# Patient Record
Sex: Female | Born: 1980 | Race: Black or African American | Hispanic: No | Marital: Single | Smoking: Heavy tobacco smoker
Health system: Southern US, Community
[De-identification: ages and names within clinical notes are randomized; demographics above are authoritative.]

## PROBLEM LIST (undated history)

## (undated) DIAGNOSIS — G894 Chronic pain syndrome: Secondary | ICD-10-CM

## (undated) DIAGNOSIS — F1110 Opioid abuse, uncomplicated: Secondary | ICD-10-CM

## (undated) DIAGNOSIS — N12 Tubulo-interstitial nephritis, not specified as acute or chronic: Secondary | ICD-10-CM

## (undated) DIAGNOSIS — A539 Syphilis, unspecified: Secondary | ICD-10-CM

## (undated) DIAGNOSIS — D573 Sickle-cell trait: Secondary | ICD-10-CM

## (undated) DIAGNOSIS — F319 Bipolar disorder, unspecified: Secondary | ICD-10-CM

## (undated) DIAGNOSIS — M069 Rheumatoid arthritis, unspecified: Secondary | ICD-10-CM

## (undated) DIAGNOSIS — N189 Chronic kidney disease, unspecified: Secondary | ICD-10-CM

## (undated) HISTORY — PX: TUBAL LIGATION: SHX77

---

## 2005-02-18 DIAGNOSIS — F319 Bipolar disorder, unspecified: Secondary | ICD-10-CM

## 2005-02-18 HISTORY — DX: Bipolar disorder, unspecified: F31.9

## 2012-02-19 DIAGNOSIS — F1110 Opioid abuse, uncomplicated: Secondary | ICD-10-CM

## 2012-02-19 HISTORY — DX: Opioid abuse, uncomplicated: F11.10

## 2013-04-09 DIAGNOSIS — IMO0002 Reserved for concepts with insufficient information to code with codable children: Secondary | ICD-10-CM | POA: Insufficient documentation

## 2013-04-09 DIAGNOSIS — F1111 Opioid abuse, in remission: Secondary | ICD-10-CM | POA: Insufficient documentation

## 2013-09-16 DIAGNOSIS — Z309 Encounter for contraceptive management, unspecified: Secondary | ICD-10-CM | POA: Insufficient documentation

## 2014-02-18 DIAGNOSIS — M069 Rheumatoid arthritis, unspecified: Secondary | ICD-10-CM

## 2014-02-18 HISTORY — DX: Rheumatoid arthritis, unspecified: M06.9

## 2014-09-08 DIAGNOSIS — N12 Tubulo-interstitial nephritis, not specified as acute or chronic: Secondary | ICD-10-CM

## 2014-09-08 HISTORY — DX: Tubulo-interstitial nephritis, not specified as acute or chronic: N12

## 2014-11-03 ENCOUNTER — Other Ambulatory Visit: Payer: Self-pay | Admitting: Advanced Practice Midwife

## 2014-11-03 DIAGNOSIS — M069 Rheumatoid arthritis, unspecified: Secondary | ICD-10-CM

## 2014-11-03 DIAGNOSIS — F209 Schizophrenia, unspecified: Secondary | ICD-10-CM

## 2014-11-29 DIAGNOSIS — E6609 Other obesity due to excess calories: Secondary | ICD-10-CM | POA: Insufficient documentation

## 2014-12-08 ENCOUNTER — Ambulatory Visit
Admission: RE | Admit: 2014-12-08 | Discharge: 2014-12-08 | Disposition: A | Discharge status: Home / Self Care | Payer: Medicaid Other | Source: Ambulatory Visit | Attending: Maternal & Fetal Medicine | Admitting: Maternal & Fetal Medicine

## 2014-12-08 ENCOUNTER — Ambulatory Visit (HOSPITAL_BASED_OUTPATIENT_CLINIC_OR_DEPARTMENT_OTHER)
Admission: RE | Admit: 2014-12-08 | Discharge: 2014-12-08 | Disposition: A | Discharge status: Home / Self Care | Payer: Medicaid Other | Source: Ambulatory Visit | Attending: Maternal & Fetal Medicine | Admitting: Maternal & Fetal Medicine

## 2014-12-08 DIAGNOSIS — D573 Sickle-cell trait: Secondary | ICD-10-CM | POA: Diagnosis not present

## 2014-12-08 DIAGNOSIS — O99342 Other mental disorders complicating pregnancy, second trimester: Secondary | ICD-10-CM | POA: Diagnosis not present

## 2014-12-08 DIAGNOSIS — M069 Rheumatoid arthritis, unspecified: Secondary | ICD-10-CM | POA: Insufficient documentation

## 2014-12-08 DIAGNOSIS — F209 Schizophrenia, unspecified: Secondary | ICD-10-CM | POA: Diagnosis not present

## 2014-12-08 DIAGNOSIS — Z3A18 18 weeks gestation of pregnancy: Secondary | ICD-10-CM | POA: Insufficient documentation

## 2014-12-08 DIAGNOSIS — Z8279 Family history of other congenital malformations, deformations and chromosomal abnormalities: Secondary | ICD-10-CM | POA: Diagnosis not present

## 2014-12-08 HISTORY — DX: Rheumatoid arthritis, unspecified: M06.9

## 2014-12-08 HISTORY — DX: Chronic pain syndrome: G89.4

## 2014-12-08 HISTORY — DX: Opioid abuse, uncomplicated: F11.10

## 2014-12-08 HISTORY — DX: Tubulo-interstitial nephritis, not specified as acute or chronic: N12

## 2014-12-08 HISTORY — DX: Bipolar disorder, unspecified: F31.9

## 2014-12-08 HISTORY — DX: Sickle-cell trait: D57.3

## 2014-12-08 NOTE — Progress Notes (Addendum)
Referring physician:  [facility] Department Length of Consultation: 50 minutes  Stacey Howe  was referred for genetic counseling to discuss her family history and prenatal testing options.  This note is a summary of our discussion.  We first obtained a detailed family and pregnancy history.  The patient reported that her first son was born with two holes in his heart and an abnormal valve which required surgery at 32 years old.  He has since been in good health, with no developmental delays or health concerns.  She reported that his father and a paternal half sister both have heart defects as well.  Her other children are all healthy (they have different fathers), as are the two daughters of the father of the baby.    Without more information about the cause for her condition, an accurate risk assessment for other family members is difficult.  The family history is otherwise reported to be unremarkable for birth defects, mental retardation, recurrent pregnancy loss or known chromosome abnormalities.  In reviewing her medical history, it was also noted that Stacey Howe has sickle cell trait.  She is aware of this history and states that her children all have the trait as well.  She is not aware of any testing for Stacey Howe.    Stacey Howe denied any pregnancy complications thus far in this pregnancy.  She was taking several medications when she conceived which were prescribed for rheumatoid arthritis.  She stated those medications were prednisone, methotrexate and percoset.  She stopped the medications as soon as she missed her period and found out she was pregnant, prior to [redacted] weeks gestation.  We reviewed that methotrexate in particular is a medication that is known to increase the risk for birth defects, however the critical period for malformations appears to be 6-8 weeks after conception, so we would not expect this fetus to be at high risk based upon how early she stopped the medications.  [name] also has a diagnosis of bipolar.  She has not taken medications for this in several years.  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  The patient denied recreational drug and alcoholuse in pregnancy.  She has cut down to 2 cigarettes per day.  As we discussed, smoking during pregnancy has been associated with low birth weight, premature delivery and pregnancy loss.  For this reason, we suggest that she cut back or avoid smoking for the remainder of the pregnancy.  Mental health conditions are known to have strong inherited factors in some families.  Though we do not fully understand what the genetic factors are, we would expect that this child may be at increased risk for some mental health conditions.  We discussed that congenital heart defects (CHDs) occur in approximately 1 in 200 births.  Congenital heart defects are often thought to be multifactorial, meaning due to complex interactions between genetic and environmental factors, although some specific genetic changes and syndromes are associated with congenital heart defects.  In the absence of an underlying genetic condition, the chance of congenital heart defects in siblings of individuals with heart defects is thought to be 3-5%.  However, given the fact that in this family, the father of her son was also affected, and this pregnancy is not related to him, the risks are likely lower.  Though the chance for a  similar condition in this pregnancy is low, we did offer the option of a fetal echocardiogram at 22 to 24 weeks in this and subsequent pregnancies for this pregnancy.    We also discussed that 1 in 10 individuals of African American ancestry have sickle cell trait.  We reviewed some general information about sickle cell disease and trait.  Sickle cell anemia is caused by a change in the  hemoglobin.  Hemoglobin is the substance in red blood cells that carries oxygen.  When there is a change in the structure of the hemoglobin, there are problems in the way the blood carries oxygen.  One specific change causes the cells to take on a sickle, or half moon, shape instead of the usual round shape.  This is called sickle cell anemia.  People with sickle cell disease are at an increased risk for infections, stroke, damage to certain organs, painful crises and other medical complications.  Sickle cell disease is an autosomal recessive condition, meaning that both parents must be carriers in order to have a child with the condition. Because Stacey Howe is known to be a carrier, we offered to carrier testing to her partner, Stacey Howe. She planned to speak with him and have him tested at [facility] if desired.  We would recommend a CBC and hemoglobin fractionation.  We are happy to review those records if he chooses testing. If he is found to be a carrier, then testing on the pregnancy could be considered.  All babies in [place] are testing through newborn screening for sickle cell disease.  A detailed anatomy ultrasound was performed today.  See that report for full details. Records indicate the patient had maternal serum screening at [facility].  We are happy to review those records and discuss any concerns with the patient.  A fetal echocardiogram was also scheduled for after [redacted] weeks gestation.  We appreciate very much the opportunity to be involved with the care of your patient.  If the patient has further questions or concerns, please do not hesitate to call us at [phone].  Stacey Anderson, MS, CGC

## 2014-12-08 NOTE — Progress Notes (Signed)
Genetic counseling recommendations and assessment reviewed with [facility] Wells, I agree with her consultation as outlined.  

## 2014-12-25 DIAGNOSIS — Z5900 Homelessness unspecified: Secondary | ICD-10-CM | POA: Insufficient documentation

## 2014-12-25 DIAGNOSIS — F319 Bipolar disorder, unspecified: Secondary | ICD-10-CM | POA: Insufficient documentation

## 2014-12-25 DIAGNOSIS — O99332 Smoking (tobacco) complicating pregnancy, second trimester: Secondary | ICD-10-CM | POA: Insufficient documentation

## 2014-12-25 DIAGNOSIS — O0992 Supervision of high risk pregnancy, unspecified, second trimester: Secondary | ICD-10-CM | POA: Insufficient documentation

## 2014-12-25 DIAGNOSIS — O34219 Maternal care for unspecified type scar from previous cesarean delivery: Secondary | ICD-10-CM | POA: Insufficient documentation

## 2014-12-25 DIAGNOSIS — M069 Rheumatoid arthritis, unspecified: Secondary | ICD-10-CM | POA: Insufficient documentation

## 2014-12-25 DIAGNOSIS — O09292 Supervision of pregnancy with other poor reproductive or obstetric history, second trimester: Secondary | ICD-10-CM | POA: Insufficient documentation

## 2014-12-25 DIAGNOSIS — D573 Sickle-cell trait: Secondary | ICD-10-CM | POA: Insufficient documentation

## 2014-12-26 DIAGNOSIS — Z302 Encounter for sterilization: Secondary | ICD-10-CM | POA: Insufficient documentation

## 2015-03-01 DIAGNOSIS — N760 Acute vaginitis: Secondary | ICD-10-CM | POA: Insufficient documentation

## 2015-04-03 ENCOUNTER — Encounter: Payer: Self-pay | Admitting: Urgent Care

## 2015-04-03 DIAGNOSIS — O99513 Diseases of the respiratory system complicating pregnancy, third trimester: Secondary | ICD-10-CM | POA: Diagnosis not present

## 2015-04-03 DIAGNOSIS — J111 Influenza due to unidentified influenza virus with other respiratory manifestations: Secondary | ICD-10-CM | POA: Insufficient documentation

## 2015-04-03 DIAGNOSIS — O211 Hyperemesis gravidarum with metabolic disturbance: Secondary | ICD-10-CM | POA: Diagnosis not present

## 2015-04-03 DIAGNOSIS — Z3A36 36 weeks gestation of pregnancy: Secondary | ICD-10-CM | POA: Insufficient documentation

## 2015-04-03 DIAGNOSIS — F17210 Nicotine dependence, cigarettes, uncomplicated: Secondary | ICD-10-CM | POA: Insufficient documentation

## 2015-04-03 DIAGNOSIS — O99333 Smoking (tobacco) complicating pregnancy, third trimester: Secondary | ICD-10-CM | POA: Insufficient documentation

## 2015-04-03 DIAGNOSIS — Z79899 Other long term (current) drug therapy: Secondary | ICD-10-CM | POA: Diagnosis not present

## 2015-04-03 LAB — URINALYSIS COMPLETE WITH MICROSCOPIC (ARMC ONLY)
BACTERIA UA: NONE SEEN
Bilirubin Urine: NEGATIVE
GLUCOSE, UA: NEGATIVE mg/dL
HGB URINE DIPSTICK: NEGATIVE
Nitrite: NEGATIVE
PROTEIN: NEGATIVE mg/dL
Specific Gravity, Urine: 1.012 (ref 1.005–1.030)
pH: 6 (ref 5.0–8.0)

## 2015-04-03 LAB — BASIC METABOLIC PANEL
Anion gap: 8 (ref 5–15)
BUN: <5 mg/dL — ABNORMAL LOW (ref 6–20)
CALCIUM: 8.3 mg/dL — AB (ref 8.9–10.3)
CO2: 21 mmol/L — AB (ref 22–32)
CREATININE: 0.66 mg/dL (ref 0.44–1.00)
Chloride: 104 mmol/L (ref 101–111)
GFR calc Af Amer: >60 mL/min (ref >60)
GFR calc non Af Amer: >60 mL/min (ref >60)
GLUCOSE: 83 mg/dL (ref 65–99)
Potassium: 3.3 mmol/L — ABNORMAL LOW (ref 3.5–5.1)
Sodium: 133 mmol/L — ABNORMAL LOW (ref 135–145)

## 2015-04-03 LAB — CBC
HEMATOCRIT: 30 % — AB (ref 35.0–47.0)
Hemoglobin: 10 g/dL — ABNORMAL LOW (ref 12.0–16.0)
MCH: 26.1 pg (ref 26.0–34.0)
MCHC: 33.3 g/dL (ref 32.0–36.0)
MCV: 78.4 fL — AB (ref 80.0–100.0)
Platelets: 147 10*3/uL — ABNORMAL LOW (ref 150–440)
RBC: 3.83 MIL/uL (ref 3.80–5.20)
RDW: 14.1 % (ref 11.5–14.5)
WBC: 9.9 10*3/uL (ref 3.6–11.0)

## 2015-04-03 MED ORDER — SODIUM CHLORIDE 0.9 % IV BOLUS (SEPSIS)
1000.0000 mL | Freq: Once | INTRAVENOUS | Status: AC
  Administered 2015-04-03: 1000 mL via INTRAVENOUS

## 2015-04-03 NOTE — ED Notes (Signed)
Patient presents with c/o flu-like symptoms x 2 days. Patient went to [facility] today and was to be admitted, however patient refused because of transportation issues. Patient with (+) N/V - Dx'd with flu. Of note, patient is a 36 week [id number]. Denies abd pain.

## 2015-04-04 ENCOUNTER — Emergency Department
Admission: EM | Admit: 2015-04-04 | Discharge: 2015-04-04 | Disposition: A | Discharge status: Home / Self Care | Payer: Medicaid Other | Attending: Emergency Medicine | Admitting: Emergency Medicine

## 2015-04-04 DIAGNOSIS — E860 Dehydration: Secondary | ICD-10-CM

## 2015-04-04 DIAGNOSIS — J111 Influenza due to unidentified influenza virus with other respiratory manifestations: Secondary | ICD-10-CM

## 2015-04-04 LAB — URINALYSIS COMPLETE WITH MICROSCOPIC (ARMC ONLY)
BILIRUBIN URINE: NEGATIVE
GLUCOSE, UA: NEGATIVE mg/dL
Hgb urine dipstick: NEGATIVE
Nitrite: NEGATIVE
PH: 6 (ref 5.0–8.0)
Protein, ur: NEGATIVE mg/dL
Specific Gravity, Urine: 1.005 (ref 1.005–1.030)

## 2015-04-04 MED ORDER — SODIUM CHLORIDE 0.9 % IV BOLUS (SEPSIS)
1000.0000 mL | Freq: Once | INTRAVENOUS | Status: AC
  Administered 2015-04-04: 1000 mL via INTRAVENOUS

## 2015-04-04 MED ORDER — OSELTAMIVIR PHOSPHATE 75 MG PO CAPS
75.0000 mg | ORAL_CAPSULE | Freq: Once | ORAL | Status: DC
  Filled 2015-04-04: qty 1

## 2015-04-04 NOTE — Discharge Instructions (Signed)
Dehydration, Adult Dehydration is a condition in which you do not have enough fluid or water in your body. It happens when you take in less fluid than you lose. Vital organs such as the kidneys, brain, and heart cannot function without a proper amount of fluids. Any loss of fluids from the body can cause dehydration.  Dehydration can range from mild to severe. This condition should be treated right away to help prevent it from becoming severe. CAUSES  This condition may be caused by:  Vomiting.  Diarrhea.  Excessive sweating, such as when exercising in hot or humid weather.  Not drinking enough fluid during strenuous exercise or during an illness.  Excessive urine output.  Fever.  Certain medicines. RISK FACTORS This condition is more likely to develop in:  People who are taking certain medicines that cause the body to lose excess fluid (diuretics).   People who have a chronic illness, such as diabetes, that may increase urination.  Older adults.   People who live at high altitudes.   People who participate in endurance sports.  SYMPTOMS  Mild Dehydration  Thirst.  Dry lips.  Slightly dry mouth.  Dry, warm skin. Moderate Dehydration  Very dry mouth.   Muscle cramps.   Dark urine and decreased urine production.   Decreased tear production.   Headache.   Light-headedness, especially when you stand up from a sitting position.  Severe Dehydration  Changes in skin.   Cold and clammy skin.   Skin does not spring back quickly when lightly pinched and released.   Changes in body fluids.   Extreme thirst.   No tears.   Not able to sweat when body temperature is high, such as in hot weather.   Minimal urine production.   Changes in vital signs.   Rapid, weak pulse (more than 100 beats per minute when you are sitting still).   Rapid breathing.   Low blood pressure.   Other changes.   Sunken eyes.   Cold hands and feet.    Confusion.  Lethargy and difficulty being awakened.  Fainting (syncope).   Short-term weight loss.   Unconsciousness. DIAGNOSIS  This condition may be diagnosed based on your symptoms. You may also have tests to determine how severe your dehydration is. These tests may include:   Urine tests.   Blood tests.  TREATMENT  Treatment for this condition depends on the severity. Mild or moderate dehydration can often be treated at home. Treatment should be started right away. Do not wait until dehydration becomes severe. Severe dehydration needs to be treated at the hospital. Treatment for Mild Dehydration  Drinking plenty of water to replace the fluid you have lost.   Replacing minerals in your blood (electrolytes) that you may have lost.  Treatment for Moderate Dehydration  Consuming oral rehydration solution (ORS). Treatment for Severe Dehydration  Receiving fluid through an IV tube.   Receiving electrolyte solution through a feeding tube that is passed through your nose and into your stomach (nasogastric tube or NG tube).  Correcting any abnormalities in electrolytes. HOME CARE INSTRUCTIONS   Drink enough fluid to keep your urine clear or pale yellow.   Drink water or fluid slowly by taking small sips. You can also try sucking on ice cubes.  Have food or beverages that contain electrolytes. Examples include bananas and sports drinks.  Take over-the-counter and prescription medicines only as told by your health care provider.   Prepare ORS according to the manufacturer's instructions. Take sips   of ORS every 5 minutes until your urine returns to normal.  If you have vomiting or diarrhea, continue to try to drink water, ORS, or both.   If you have diarrhea, avoid:   Beverages that contain caffeine.   Fruit juice.   Milk.   Carbonated soft drinks.  Do not take salt tablets. This can lead to the condition of having too much sodium in your body  (hypernatremia).  SEEK MEDICAL CARE IF:  You cannot eat or drink without vomiting.  You have had moderate diarrhea during a period of more than 24 hours.  You have a fever. SEEK IMMEDIATE MEDICAL CARE IF:   You have extreme thirst.  You have severe diarrhea.  You have not urinated in 6-8 hours, or you have urinated only a small amount of very dark urine.  You have shriveled skin.  You are dizzy, confused, or both.   This information is not intended to replace advice given to you by your health care provider. Make sure you discuss any questions you have with your health care provider.   Document Released: 02/04/2005 Document Revised: 10/26/2014 Document Reviewed: 06/22/2014 Elsevier Interactive Patient Education 2016 [employer] Inc[employer]  Influenza, Adult Influenza ("the flu") is a viral infection of the respiratory tract. It occurs more often in winter months because people spend more time in close contact with one another. Influenza can make you feel very sick. Influenza easily spreads from person to person (contagious). CAUSES  Influenza is caused by a virus that infects the respiratory tract. You can catch the virus by breathing in droplets from an infected person's cough or sneeze. You can also catch the virus by touching something that was recently contaminated with the virus and then touching your mouth, nose, or eyes. RISKS AND COMPLICATIONS You may be at risk for a more severe case of influenza if you smoke cigarettes, have diabetes, have chronic heart disease (such as heart failure) or lung disease (such as asthma), or if you have a weakened immune system. Elderly people and pregnant women are also at risk for more serious infections. The most common problem of influenza is a lung infection (pneumonia). Sometimes, this problem can require emergency medical care and may be life threatening. SIGNS AND SYMPTOMS  Symptoms typically last 4 to 10 days and may  include:  Fever.  Chills.  Headache, body aches, and muscle aches.  Sore throat.  Chest discomfort and cough.  Poor appetite.  Weakness or feeling tired.  Dizziness.  Nausea or vomiting. DIAGNOSIS  Diagnosis of influenza is often made based on your history and a physical exam. A nose or throat swab test can be done to confirm the diagnosis. TREATMENT  In mild cases, influenza goes away on its own. Treatment is directed at relieving symptoms. For more severe cases, your health care provider may prescribe antiviral medicines to shorten the sickness. Antibiotic medicines are not effective because the infection is caused by a virus, not by bacteria. HOME CARE INSTRUCTIONS  Take medicines only as directed by your health care provider.  Use a cool mist humidifier to make breathing easier.  Get plenty of rest until your temperature returns to normal. This usually takes 3 to 4 days.  Drink enough fluid to keep your urine clear or pale yellow.  Cover yourmouth and nosewhen coughing or sneezing,and wash your handswellto prevent thevirusfrom spreading.  Stay homefromwork orschool untilthe fever is gonefor at least 1full day. PREVENTION  An annual influenza vaccination (flu shot) is the best   way to avoid getting influenza. An annual flu shot is now routinely recommended for all adults in the [place] SEEK MEDICAL CARE IF:  You experiencechest pain, yourcough worsens,or you producemore mucus.  Youhave nausea,vomiting, ordiarrhea.  Your fever returns or gets worse. SEEK IMMEDIATE MEDICAL CARE IF:  You havetrouble breathing, you become short of breath,or your skin ornails becomebluish.  You have severe painor stiffnessin the neck.  You develop a sudden headache, or pain in the face or ear.  You have nausea or vomiting that you cannot control. MAKE SURE YOU:   Understand these instructions.  Will watch your condition.  Will get help right away if you  are not doing well or get worse.   This information is not intended to replace advice given to you by your health care provider. Make sure you discuss any questions you have with your health care provider.   Document Released: 02/02/2000 Document Revised: 02/25/2014 Document Reviewed: 05/06/2011 Elsevier Interactive Patient Education 2016 Elsevier Inc.  

## 2015-04-04 NOTE — ED Notes (Signed)
Patient with no complaints at this time. Respirations even and unlabored. Skin warm/dry. Discharge instructions reviewed with patient at this time. Patient given opportunity to voice concerns/ask questions. IV removed per policy and band-aid applied to site. Patient discharged at this time and left Emergency Department, via wheelchair.   

## 2015-04-04 NOTE — ED Provider Notes (Signed)
[facility] Emergency Department Provider Note  ____________________________________________  Time seen: 1:30 AM  I have reviewed the triage vital signs and the nursing notes.   HISTORY  Chief Complaint Influenza      HPI Stacey Howe is a 35 y.o. female G9 P4 abortions 336 weeks pregnant presents with generalized body aches and subjective fevers cough 3 days. Patient states that she was diagnosed with a fluid [facility] yesterday" (they wanted to keep me to give me fluids". However the patient states that she was unable to stay at that time due to childcare issues.     Past Medical History  Diagnosis Date  . Pyelonephritis 09/08/14  . 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 Chronic pain syndrome   . Rheumatoid arthritis (HCC) 2016  . Sickle cell trait (HCC)   . Bipolar affective disorder [facility]) 2007    Patient Active Problem List   Diagnosis Date Noted  . Family history of congenital heart defect 12/08/2014    Past Surgical History  Procedure Laterality Date  . Cesarean section  2000  . Cesarean section  2015    Current Outpatient Rx  Name  Route  Sig  Dispense  Refill  . acetaminophen (TYLENOL) 500 MG tablet   Oral   Take 500 mg by mouth every 6 (six) hours as needed for mild pain or headache.         . calcium carbonate (TUMS EX) 750 MG chewable tablet   Oral   Chew 1 tablet by mouth daily.         . Prenatal Vit-Fe Fumarate-FA (PRENATAL MULTIVITAMIN) TABS tablet   Oral   Take 1 tablet by mouth daily at 12 noon.           Allergies Vicodin  No family history on file.  Social History Social History  Substance Use Topics  . Smoking status: Light Tobacco Smoker -- 0.25 packs/day for 15 years    Types: Cigarettes  . Smokeless tobacco: Never Used  . Alcohol Use: No    Review of Systems  Constitutional: Positive for fever. Eyes: Negative for visual changes. ENT: Negative for sore  throat. Cardiovascular: Negative for chest pain. Respiratory: Negative for shortness of breath. Gastrointestinal: Negative for abdominal pain, vomiting and diarrhea. Genitourinary: Negative for dysuria. Musculoskeletal: Negative for back pain. Positive for generalized myalgia Skin: Negative for rash. Neurological: Negative for headaches, focal weakness or numbness.   10-point ROS otherwise negative.  ____________________________________________   PHYSICAL EXAM:  VITAL SIGNS: ED Triage Vitals  Enc Vitals Group     BP 04/03/15 2134 110/64 mmHg     Pulse Rate 04/03/15 2134 122     Resp 04/03/15 2134 20     Temp 04/03/15 2134 98.2 F (36.8 C)     Temp Source 04/03/15 2134 Oral     SpO2 04/03/15 2134 100 %     Weight 04/03/15 2134 216 lb (97.977 kg)     Height 04/03/15 2134 5\' 6"  (1.676 m)     Head Cir --      Peak Flow --      Pain Score 04/03/15 2134 10     Pain Loc --      Pain Edu? --      Excl. in GC? --      Constitutional: Alert and oriented. Well appearing and in no distress. Eyes: Conjunctivae are normal. PERRL. Normal extraocular movements. ENT   Head: Normocephalic and atraumatic.  Nose: No congestion/rhinnorhea.   Mouth/Throat: Dry oral and nasal mucosa   Neck: No stridor. Hematological/Lymphatic/Immunilogical: No cervical lymphadenopathy. Cardiovascular: Normal rate, regular rhythm. Normal and symmetric distal pulses are present in all extremities. No murmurs, rubs, or gallops. Respiratory: Normal respiratory effort without tachypnea nor retractions. Breath sounds are clear and equal bilaterally. No wheezes/rales/rhonchi. Gastrointestinal: Soft and nontender. No distention. There is no CVA tenderness. Genitourinary: deferred Musculoskeletal: Nontender with normal range of motion in all extremities. No joint effusions.  No lower extremity tenderness nor edema. Neurologic:  Normal speech and language. No gross focal neurologic deficits are  appreciated. Speech is normal.  Skin:  Skin is warm, dry and intact. No rash noted. Psychiatric: Mood and affect are normal. Speech and behavior are normal. Patient exhibits appropriate insight and judgment.  ____________________________________________    LABS (pertinent positives/negatives)  Labs Reviewed  CBC - Abnormal; Notable for the following:    Hemoglobin 10.0 (*)    HCT 30.0 (*)    MCV 78.4 (*)    Platelets 147 (*)    All other components within normal limits  BASIC METABOLIC PANEL - Abnormal; Notable for the following:    Sodium 133 (*)    Potassium 3.3 (*)    CO2 21 (*)    BUN <5 (*)    Calcium 8.3 (*)    All other components within normal limits  URINALYSIS COMPLETEWITH MICROSCOPIC ([facility] ONLY) - Abnormal; Notable for the following:    Color, Urine YELLOW (*)    APPearance HAZY (*)    Ketones, ur 2+ (*)    Leukocytes, UA 3+ (*)    Squamous Epithelial / LPF 6-30 (*)    All other components within normal limits        INITIAL IMPRESSION / ASSESSMENT AND PLAN / ED COURSE  Pertinent labs & imaging results that were available during my care of the patient were reviewed by me and considered in my medical decision making (see chart for details).  2 liters IV NS given which symptomatically improvement. Patient advised to continue oral hydration at home. In addition patient advised to follow up with OB/GYN at [facility] today  ____________________________________________   FINAL CLINICAL IMPRESSION(S) / ED DIAGNOSES  Final diagnoses:  Influenza  Dehydration      Darci Current, MD 04/05/15 2250

## 2015-04-09 DIAGNOSIS — R899 Unspecified abnormal finding in specimens from other organs, systems and tissues: Secondary | ICD-10-CM | POA: Insufficient documentation

## 2015-05-31 DIAGNOSIS — F418 Other specified anxiety disorders: Secondary | ICD-10-CM

## 2015-05-31 DIAGNOSIS — O99345 Other mental disorders complicating the puerperium: Secondary | ICD-10-CM | POA: Insufficient documentation

## 2015-09-27 ENCOUNTER — Emergency Department
Admission: EM | Admit: 2015-09-27 | Discharge: 2015-09-27 | Disposition: A | Discharge status: Home / Self Care | Payer: Medicaid Other | Attending: Emergency Medicine | Admitting: Emergency Medicine

## 2015-09-27 ENCOUNTER — Emergency Department: Payer: Medicaid Other

## 2015-09-27 DIAGNOSIS — N12 Tubulo-interstitial nephritis, not specified as acute or chronic: Secondary | ICD-10-CM | POA: Insufficient documentation

## 2015-09-27 DIAGNOSIS — Z79899 Other long term (current) drug therapy: Secondary | ICD-10-CM | POA: Diagnosis not present

## 2015-09-27 DIAGNOSIS — F17210 Nicotine dependence, cigarettes, uncomplicated: Secondary | ICD-10-CM | POA: Diagnosis not present

## 2015-09-27 DIAGNOSIS — R1032 Left lower quadrant pain: Secondary | ICD-10-CM | POA: Diagnosis present

## 2015-09-27 LAB — URINALYSIS COMPLETE WITH MICROSCOPIC (ARMC ONLY)
BACTERIA UA: NONE SEEN
Bilirubin Urine: NEGATIVE
Glucose, UA: NEGATIVE mg/dL
Ketones, ur: NEGATIVE mg/dL
Nitrite: NEGATIVE
PH: 7 (ref 5.0–8.0)
PROTEIN: 100 mg/dL — AB
SPECIFIC GRAVITY, URINE: 1.006 (ref 1.005–1.030)

## 2015-09-27 LAB — COMPREHENSIVE METABOLIC PANEL
ALBUMIN: 4 g/dL (ref 3.5–5.0)
ALT: 19 U/L (ref 14–54)
AST: 18 U/L (ref 15–41)
Alkaline Phosphatase: 63 U/L (ref 38–126)
Anion gap: 4 — ABNORMAL LOW (ref 5–15)
BUN: 9 mg/dL (ref 6–20)
CHLORIDE: 103 mmol/L (ref 101–111)
CO2: 31 mmol/L (ref 22–32)
Calcium: 8.8 mg/dL — ABNORMAL LOW (ref 8.9–10.3)
Creatinine, Ser: 1.03 mg/dL — ABNORMAL HIGH (ref 0.44–1.00)
GFR calc Af Amer: >60 mL/min (ref >60)
GLUCOSE: 92 mg/dL (ref 65–99)
POTASSIUM: 3.2 mmol/L — AB (ref 3.5–5.1)
Sodium: 138 mmol/L (ref 135–145)
Total Bilirubin: 0.9 mg/dL (ref 0.3–1.2)
Total Protein: 8.1 g/dL (ref 6.5–8.1)

## 2015-09-27 LAB — CBC
HEMATOCRIT: 41.3 % (ref 35.0–47.0)
Hemoglobin: 14.2 g/dL (ref 12.0–16.0)
MCH: 27.2 pg (ref 26.0–34.0)
MCHC: 34.3 g/dL (ref 32.0–36.0)
MCV: 79.2 fL — AB (ref 80.0–100.0)
Platelets: 183 10*3/uL (ref 150–440)
RBC: 5.21 MIL/uL — ABNORMAL HIGH (ref 3.80–5.20)
RDW: 14.8 % — AB (ref 11.5–14.5)
WBC: 10.9 10*3/uL (ref 3.6–11.0)

## 2015-09-27 LAB — POCT PREGNANCY, URINE: PREG TEST UR: NEGATIVE

## 2015-09-27 LAB — LIPASE, BLOOD: LIPASE: 37 U/L (ref 11–51)

## 2015-09-27 MED ORDER — KETOROLAC TROMETHAMINE 30 MG/ML IJ SOLN
15.0000 mg | Freq: Once | INTRAMUSCULAR | Status: AC
  Administered 2015-09-27: 15 mg via INTRAVENOUS
  Filled 2015-09-27: qty 1

## 2015-09-27 MED ORDER — DEXTROSE 5 % IV SOLN
1.0000 g | INTRAVENOUS | Status: DC
  Administered 2015-09-27: 1 g via INTRAVENOUS
  Filled 2015-09-27: qty 10

## 2015-09-27 MED ORDER — CEPHALEXIN 500 MG PO CAPS: 500.0000 mg | ORAL_CAPSULE | Freq: Three times a day (TID) | ORAL | 0 refills | Status: DC

## 2015-09-27 MED ORDER — ONDANSETRON 4 MG PO TBDP: ORAL_TABLET | ORAL | 0 refills | Status: DC

## 2015-09-27 MED ORDER — ONDANSETRON HCL 4 MG/2ML IJ SOLN
4.0000 mg | INTRAMUSCULAR | Status: AC
  Administered 2015-09-27: 4 mg via INTRAVENOUS
  Filled 2015-09-27: qty 2

## 2015-09-27 NOTE — Discharge Instructions (Signed)
You have been seen in the Emergency Department (ED) today for pain that we believe is due to a urinary tract infection (UTI) which has affected your kidney as well.  Please take THE FULL COURSE of antibiotics as prescribed and over-the-counter pain medication (Tylenol or Motrin) as needed, but no more than recommended on the label instructions.  Drink PLENTY of fluids.  Call your regular doctor to schedule the next available appointment to follow up on today?s ED visit, or return immediately to the ED if your pain worsens, you have decreased urine production, develop fever, persistent vomiting, or other symptoms that concern you.

## 2015-09-27 NOTE — ED Triage Notes (Signed)
Pt arrives to ED from home via [facility] with c/o LEFT flank pain with radiation into LEFT upper and lower abdomen. Pt reports s/x's began yesterday morning and have been constant. Pt also w/o c/o urinary frequency, pain, burning, and malodor. Pt reports taking (10) 500mg  Tylenol tablets without relief. Pt also reports taking (2) Tylenol PM about 4 hrs ago. Pt reports h/x of arthritis and kidney infections and UTIs, but denies any previous GI history.

## 2015-09-27 NOTE — ED Notes (Signed)
Patient transported to CT 

## 2015-09-27 NOTE — ED Provider Notes (Signed)
[facility] Emergency Department Provider Note  ____________________________________________   First MD Initiated Contact with Patient 09/27/15 0425     (approximate)  I have reviewed the triage vital signs and the nursing notes.   HISTORY  Chief Complaint Abdominal Pain and Urinary Tract Infection    HPI Stacey Howe is a 35 y.o. female with a medical history as listed below who presents for acute onset severe left flank pain that occurred about 24 hours ago.  She states that it started all of a sudden felt like someone stabbing her in the left flank.  It was moderate in intensity initially but it is worsened over time and is now severe.  She is also having some burning dysuria.  She states that movement makes the pain worse and she cannot find a comfortable position.  She has also had 3 episodes of vomiting today and has been feeling chills and has no recorded fever.  She denies chest pain, shortness of breath, vaginal bleeding, vaginal discharge.  She has no lower abdominal pain, seems to all be in her flank.  She denies a history of kidney stones but she has had urinary tract infections in the past.   Past Medical History:  Diagnosis Date  . Bipolar affective disorder (HCC) 2007  . Chronic pain syndrome   . 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 Pyelonephritis 09/08/14  . Rheumatoid arthritis (HCC) 2016  . Sickle cell trait [facility])     Patient Active Problem List   Diagnosis Date Noted  . Family history of congenital heart defect 12/08/2014    Past Surgical History:  Procedure Laterality Date  . CESAREAN SECTION  2000  . CESAREAN SECTION  2015  . TUBAL LIGATION      Prior to Admission medications   Medication Sig Start Date End Date Taking? Authorizing Provider  clonazePAM (KLONOPIN) 1 MG tablet Take 1 mg by mouth 3 (three) times daily as needed for anxiety.   Yes Historical Provider, MD  methotrexate (RHEUMATREX) 2.5 MG  tablet Take 10 mg by mouth once a week. Caution:Chemotherapy. Protect from light.   Yes Historical Provider, MD  sertraline (ZOLOFT) 100 MG tablet Take 100 mg by mouth daily.   Yes Historical Provider, MD  acetaminophen (TYLENOL) 500 MG tablet Take 500 mg by mouth every 6 (six) hours as needed for mild pain or headache.    Historical Provider, MD  calcium carbonate (TUMS EX) 750 MG chewable tablet Chew 1 tablet by mouth daily.    Historical Provider, MD  cephALEXin (KEFLEX) 500 MG capsule Take 1 capsule (500 mg total) by mouth 3 (three) times daily. 09/27/15   Loleta Rose, MD  ondansetron (ZOFRAN ODT) 4 MG disintegrating tablet Allow 1-2 tablets to dissolve in your mouth every 8 hours as needed for nausea/vomiting 09/27/15   Loleta Rose, MD  Prenatal Vit-Fe Fumarate-FA (PRENATAL MULTIVITAMIN) TABS tablet Take 1 tablet by mouth daily at 12 noon.    Historical Provider, MD    Allergies Vicodin [hydrocodone-acetaminophen]  History reviewed. No pertinent family history.  Social History Social History  Substance Use Topics  . Smoking status: Light Tobacco Smoker    Packs/day: 0.25    Years: 15.00    Types: Cigarettes  . Smokeless tobacco: Never Used  . Alcohol use No    Review of Systems Constitutional: +chills Eyes: No visual changes. ENT: No sore throat. Cardiovascular: Denies chest pain. Respiratory: Denies shortness of breath. Gastrointestinal: No abdominal  pain.  Severe left flank pain.  Nausea and emesis x 3.  No diarrhea.  No constipation. Genitourinary: +dysuria. Musculoskeletal: Left flank pain. Skin: Negative for rash. Neurological: Negative for headaches, focal weakness or numbness.  10-point ROS otherwise negative.  ____________________________________________   PHYSICAL EXAM:  VITAL SIGNS: ED Triage Vitals  Enc Vitals Group     BP 09/27/15 0218 113/75     Pulse Rate 09/27/15 0218 96     Resp 09/27/15 0218 17     Temp 09/27/15 0218 98.4 F (36.9 C)     Temp  Source 09/27/15 0218 Oral     SpO2 09/27/15 0218 100 %     Weight 09/27/15 0218 200 lb (90.7 kg)     Height 09/27/15 0218 5\' 6"  (1.676 m)     Head Circumference --      Peak Flow --      Pain Score 09/27/15 0220 10     Pain Loc --      Pain Edu? --      Excl. in GC? --     Constitutional: Alert and oriented. Generally well appearing but appears uncomfortable Eyes: Conjunctivae are normal. PERRL. EOMI. Head: Atraumatic. Nose: No congestion/rhinnorhea. Mouth/Throat: Mucous membranes are moist.  Oropharynx non-erythematous. Neck: No stridor.  No meningeal signs.   Cardiovascular: Normal rate, regular rhythm. Good peripheral circulation. Grossly normal heart sounds.   Respiratory: Normal respiratory effort.  No retractions. Lungs CTAB. Gastrointestinal: Soft and nontender. No distention.  Musculoskeletal: No lower extremity tenderness nor edema. No gross deformities of extremities.  Severe left flank tenderness Neurologic:  Normal speech and language. No gross focal neurologic deficits are appreciated.  Skin:  Skin is warm, dry and intact. No rash noted. Psychiatric: Mood and affect are normal. Speech and behavior are normal.  ____________________________________________   LABS (all labs ordered are listed, but only abnormal results are displayed)  Labs Reviewed  COMPREHENSIVE METABOLIC PANEL - Abnormal; Notable for the following:       Result Value   Potassium 3.2 (*)    Creatinine, Ser 1.03 (*)    Calcium 8.8 (*)    Anion gap 4 (*)    All other components within normal limits  CBC - Abnormal; Notable for the following:    RBC 5.21 (*)    MCV 79.2 (*)    RDW 14.8 (*)    All other components within normal limits  URINALYSIS COMPLETEWITH MICROSCOPIC ([facility] ONLY) - Abnormal; Notable for the following:    Color, Urine YELLOW (*)    APPearance CLOUDY (*)    Hgb urine dipstick 3+ (*)    Protein, ur 100 (*)    Leukocytes, UA 3+ (*)    Squamous Epithelial / LPF 0-5 (*)    All  other components within normal limits  URINE CULTURE  LIPASE, BLOOD  POC URINE PREG, ED  POCT PREGNANCY, URINE   ____________________________________________  EKG  None ____________________________________________  RADIOLOGY   Ct Renal Stone Study  Result Date: 09/27/2015 CLINICAL DATA:  Acute onset of left flank pain. Left upper and lower quadrant abdominal pain. Increased urinary frequency and dysuria. Initial encounter. EXAM: CT ABDOMEN AND PELVIS WITHOUT CONTRAST TECHNIQUE: Multidetector CT imaging of the abdomen and pelvis was performed following the standard protocol without IV contrast. COMPARISON:  Pelvic ultrasound performed 12/08/2014 FINDINGS: The visualized lung bases are clear. The liver and spleen are unremarkable in appearance. The gallbladder is within normal limits. The pancreas and adrenal glands are unremarkable. Mild soft tissue  inflammation is seen about the left renal pelvis and proximal left ureter, concerning for left-sided ureteritis. The kidneys are otherwise unremarkable. There is no evidence of hydronephrosis. No renal or ureteral stones are identified. The small bowel is unremarkable in appearance. The stomach is within normal limits. No acute vascular abnormalities are seen. The appendix is normal in caliber, without evidence of appendicitis. The colon is grossly unremarkable in appearance. The bladder is mildly distended and grossly unremarkable. The uterus is grossly unremarkable. The ovaries are grossly symmetric. No suspicious adnexal masses are seen. A small amount of free fluid within the pelvis is likely physiologic in nature. No inguinal lymphadenopathy is seen. No acute osseous abnormalities are identified. IMPRESSION: Mild soft tissue inflammation about the left renal pelvis and proximal left ureter raises concern for left-sided ureteritis. No evidence of hydronephrosis. Electronically Signed   By: Roanna Raider M.D.   On: 09/27/2015 04:57     ____________________________________________   PROCEDURES  Procedure(s) performed:   Procedures   Critical Care performed: No ____________________________________________   INITIAL IMPRESSION / ASSESSMENT AND PLAN / ED COURSE  Pertinent labs & imaging results that were available during my care of the patient were reviewed by me and considered in my medical decision making (see chart for details).  VSS, labs unremarkable.  +UTI.  Most likely patient has mild pyelonephritis, take daily the onset, the presence of a significant amount of hemoglobin in the urine, and the patient's description in nature of the pain suggests to me that it is possible she may have a kidney stone.  Given the significant amount of infection it is important that we rule out an infected/impacted stone which could lead to sepsis and significant morbidity and mortality.  I will obtain a CT renal stone protocol to rule out a stone while also treating with ceftriaxone 1 g IV and Zofran.  The patient specifically requested that she not be given any narcotics, presumably due to her history of narcotics addiction, so I will treat with Toradol 15 mg IV for pain control.  Clinical Course  Value Comment By Time  CT Renal Stone Study No evidence of kidney stones/ureteral stones.  There is a suggestion of ureteritis/pyelonephritis which is consistent with the patient's symptoms.  She received ceftriaxone in the ED and I will give her a course of Keflex suspicion for pyelonephritis.  I am also giving her the name and number of Dr. Apolinar Junes with whom she can follow-up.I gave my usual and customary return precautions.  Loleta Rose, MD 08/09 0533    ____________________________________________  FINAL CLINICAL IMPRESSION(S) / ED DIAGNOSES  Final diagnoses:  Pyelonephritis     MEDICATIONS GIVEN DURING THIS VISIT:  Medications  cefTRIAXone (ROCEPHIN) 1 g in dextrose 5 % 50 mL IVPB (0 g Intravenous Stopped 09/27/15 0518)   ketorolac (TORADOL) 30 MG/ML injection 15 mg (15 mg Intravenous Given 09/27/15 0452)  ondansetron (ZOFRAN) injection 4 mg (4 mg Intravenous Given 09/27/15 0451)     NEW OUTPATIENT MEDICATIONS STARTED DURING THIS VISIT:  New Prescriptions   CEPHALEXIN (KEFLEX) 500 MG CAPSULE    Take 1 capsule (500 mg total) by mouth 3 (three) times daily.   ONDANSETRON (ZOFRAN ODT) 4 MG DISINTEGRATING TABLET    Allow 1-2 tablets to dissolve in your mouth every 8 hours as needed for nausea/vomiting      Note:  This document was prepared using Dragon voice recognition software and may include unintentional dictation errors.   Loleta Rose, MD 09/27/15 [id number]

## 2015-09-29 LAB — URINE CULTURE
Culture: >=100000 — AB
Special Requests: NORMAL

## 2015-11-08 ENCOUNTER — Encounter: Payer: Self-pay | Admitting: Pain Medicine

## 2015-11-08 ENCOUNTER — Ambulatory Visit: Payer: Medicaid Other | Attending: Pain Medicine | Admitting: Pain Medicine

## 2015-11-08 VITALS — BP 126/72 | HR 78 | Temp 98.6°F | Resp 16 | Ht 66.0 in | Wt 197.0 lb

## 2015-11-08 DIAGNOSIS — M25542 Pain in joints of left hand: Secondary | ICD-10-CM | POA: Diagnosis not present

## 2015-11-08 DIAGNOSIS — F1190 Opioid use, unspecified, uncomplicated: Secondary | ICD-10-CM | POA: Diagnosis not present

## 2015-11-08 DIAGNOSIS — M25521 Pain in right elbow: Secondary | ICD-10-CM | POA: Diagnosis not present

## 2015-11-08 DIAGNOSIS — M25531 Pain in right wrist: Secondary | ICD-10-CM | POA: Diagnosis not present

## 2015-11-08 DIAGNOSIS — E668 Other obesity: Secondary | ICD-10-CM | POA: Diagnosis not present

## 2015-11-08 DIAGNOSIS — N760 Acute vaginitis: Secondary | ICD-10-CM | POA: Diagnosis not present

## 2015-11-08 DIAGNOSIS — M25561 Pain in right knee: Secondary | ICD-10-CM

## 2015-11-08 DIAGNOSIS — Z7901 Long term (current) use of anticoagulants: Secondary | ICD-10-CM | POA: Diagnosis not present

## 2015-11-08 DIAGNOSIS — Z79899 Other long term (current) drug therapy: Secondary | ICD-10-CM | POA: Diagnosis not present

## 2015-11-08 DIAGNOSIS — Z79891 Long term (current) use of opiate analgesic: Secondary | ICD-10-CM | POA: Diagnosis not present

## 2015-11-08 DIAGNOSIS — M25532 Pain in left wrist: Secondary | ICD-10-CM | POA: Diagnosis not present

## 2015-11-08 DIAGNOSIS — M25529 Pain in unspecified elbow: Secondary | ICD-10-CM

## 2015-11-08 DIAGNOSIS — F419 Anxiety disorder, unspecified: Secondary | ICD-10-CM | POA: Diagnosis not present

## 2015-11-08 DIAGNOSIS — Z87442 Personal history of urinary calculi: Secondary | ICD-10-CM | POA: Diagnosis not present

## 2015-11-08 DIAGNOSIS — M545 Low back pain: Secondary | ICD-10-CM

## 2015-11-08 DIAGNOSIS — Z8249 Family history of ischemic heart disease and other diseases of the circulatory system: Secondary | ICD-10-CM | POA: Diagnosis not present

## 2015-11-08 DIAGNOSIS — M25522 Pain in left elbow: Secondary | ICD-10-CM | POA: Diagnosis not present

## 2015-11-08 DIAGNOSIS — M25579 Pain in unspecified ankle and joints of unspecified foot: Secondary | ICD-10-CM

## 2015-11-08 DIAGNOSIS — M25539 Pain in unspecified wrist: Secondary | ICD-10-CM

## 2015-11-08 DIAGNOSIS — M25562 Pain in left knee: Secondary | ICD-10-CM

## 2015-11-08 DIAGNOSIS — M069 Rheumatoid arthritis, unspecified: Secondary | ICD-10-CM

## 2015-11-08 DIAGNOSIS — D573 Sickle-cell trait: Secondary | ICD-10-CM | POA: Diagnosis not present

## 2015-11-08 DIAGNOSIS — M25541 Pain in joints of right hand: Secondary | ICD-10-CM | POA: Diagnosis not present

## 2015-11-08 DIAGNOSIS — Z0189 Encounter for other specified special examinations: Secondary | ICD-10-CM

## 2015-11-08 DIAGNOSIS — M25571 Pain in right ankle and joints of right foot: Secondary | ICD-10-CM | POA: Diagnosis present

## 2015-11-08 DIAGNOSIS — Z590 Homelessness: Secondary | ICD-10-CM | POA: Diagnosis not present

## 2015-11-08 DIAGNOSIS — G8929 Other chronic pain: Secondary | ICD-10-CM | POA: Diagnosis not present

## 2015-11-08 DIAGNOSIS — M25572 Pain in left ankle and joints of left foot: Secondary | ICD-10-CM | POA: Diagnosis not present

## 2015-11-08 DIAGNOSIS — Z91410 Personal history of adult physical and sexual abuse: Secondary | ICD-10-CM | POA: Diagnosis not present

## 2015-11-08 DIAGNOSIS — F17210 Nicotine dependence, cigarettes, uncomplicated: Secondary | ICD-10-CM | POA: Diagnosis not present

## 2015-11-08 DIAGNOSIS — F3189 Other bipolar disorder: Secondary | ICD-10-CM | POA: Diagnosis not present

## 2015-11-08 DIAGNOSIS — M79643 Pain in unspecified hand: Secondary | ICD-10-CM
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
## 2015-11-08 NOTE — Progress Notes (Signed)
Patient here as new patient for joint pain caused by rheumatoid arthritis.  She states she has 5 children who are all heatlhy and well.  Patient has a family history of drug abuse as well as a family hx of drug abuse.  She states that she is very stressed out at present and has a lot going on.   Safety precautions to be maintained throughout the outpatient stay will include: orient to surroundings, keep bed in low position, maintain call bell within reach at all times, provide assistance with transfer out of bed and ambulation.

## 2015-11-09 ENCOUNTER — Other Ambulatory Visit
Admission: RE | Admit: 2015-11-09 | Discharge: 2015-11-09 | Disposition: A | Discharge status: Home / Self Care | Payer: Medicaid Other | Source: Ambulatory Visit | Attending: Pain Medicine | Admitting: Pain Medicine

## 2015-11-09 ENCOUNTER — Ambulatory Visit
Admission: RE | Admit: 2015-11-09 | Discharge: 2015-11-09 | Disposition: A | Discharge status: Home / Self Care | Payer: Medicaid Other | Source: Ambulatory Visit | Attending: Pain Medicine | Admitting: Pain Medicine

## 2015-11-09 DIAGNOSIS — M25562 Pain in left knee: Secondary | ICD-10-CM

## 2015-11-09 DIAGNOSIS — M25579 Pain in unspecified ankle and joints of unspecified foot: Secondary | ICD-10-CM

## 2015-11-09 DIAGNOSIS — M25529 Pain in unspecified elbow: Secondary | ICD-10-CM

## 2015-11-09 DIAGNOSIS — M069 Rheumatoid arthritis, unspecified: Secondary | ICD-10-CM | POA: Diagnosis not present

## 2015-11-09 DIAGNOSIS — G8929 Other chronic pain: Secondary | ICD-10-CM

## 2015-11-09 DIAGNOSIS — M79643 Pain in unspecified hand: Secondary | ICD-10-CM

## 2015-11-09 DIAGNOSIS — M545 Low back pain: Principal | ICD-10-CM

## 2015-11-09 DIAGNOSIS — M25561 Pain in right knee: Secondary | ICD-10-CM

## 2015-11-09 DIAGNOSIS — M25539 Pain in unspecified wrist: Secondary | ICD-10-CM

## 2015-11-09 LAB — COMPREHENSIVE METABOLIC PANEL
ALT: 15 U/L (ref 14–54)
ANION GAP: 6 (ref 5–15)
AST: 20 U/L (ref 15–41)
Albumin: 3.5 g/dL (ref 3.5–5.0)
Alkaline Phosphatase: 55 U/L (ref 38–126)
BUN: 8 mg/dL (ref 6–20)
CHLORIDE: 105 mmol/L (ref 101–111)
CO2: 27 mmol/L (ref 22–32)
CREATININE: 0.96 mg/dL (ref 0.44–1.00)
Calcium: 9.1 mg/dL (ref 8.9–10.3)
Glucose, Bld: 72 mg/dL (ref 65–99)
POTASSIUM: 3.6 mmol/L (ref 3.5–5.1)
Sodium: 138 mmol/L (ref 135–145)
Total Bilirubin: 0.6 mg/dL (ref 0.3–1.2)
Total Protein: 6.9 g/dL (ref 6.5–8.1)

## 2015-11-09 LAB — MAGNESIUM: MAGNESIUM: 2.1 mg/dL (ref 1.7–2.4)

## 2015-11-09 LAB — C-REACTIVE PROTEIN

## 2015-11-09 LAB — SEDIMENTATION RATE: Sed Rate: 4 mm/hr (ref 0–20)

## 2015-11-09 LAB — VITAMIN B12: VITAMIN B 12: 205 pg/mL (ref 180–914)

## 2015-11-10 DIAGNOSIS — Z0189 Encounter for other specified special examinations: Secondary | ICD-10-CM | POA: Insufficient documentation

## 2015-11-10 LAB — ANA COMPREHENSIVE PANEL
Anti JO-1: <0.2 AI (ref 0.0–0.9)
Centromere Ab Screen: <0.2 AI (ref 0.0–0.9)
Chromatin Ab SerPl-aCnc: <0.2 AI (ref 0.0–0.9)
ENA SM Ab Ser-aCnc: <0.2 AI (ref 0.0–0.9)
Ribonucleic Protein: <0.2 AI (ref 0.0–0.9)
SSA (Ro) (ENA) Antibody, IgG: 4.3 AI — ABNORMAL HIGH (ref 0.0–0.9)
SSB (La) (ENA) Antibody, IgG: <0.2 AI (ref 0.0–0.9)
Scleroderma (Scl-70) (ENA) Antibody, IgG: <0.2 AI (ref 0.0–0.9)
ds DNA Ab: <1 [IU]/mL (ref 0–9)

## 2015-11-10 LAB — RHEUMATOID FACTOR

## 2015-11-12 LAB — 25-HYDROXY VITAMIN D LCMS D2+D3
25-Hydroxy, Vitamin D-2: <1 ng/mL
25-Hydroxy, Vitamin D-3: 18 ng/mL
25-Hydroxy, Vitamin D: 18 ng/mL — ABNORMAL LOW

## 2015-11-13 ENCOUNTER — Other Ambulatory Visit: Payer: Self-pay

## 2015-11-15 LAB — COMPLIANCE DRUG ANALYSIS, UR

## 2015-12-03 ENCOUNTER — Encounter: Payer: Self-pay | Admitting: Pain Medicine

## 2015-12-03 ENCOUNTER — Other Ambulatory Visit: Payer: Self-pay | Admitting: Pain Medicine

## 2015-12-03 DIAGNOSIS — E559 Vitamin D deficiency, unspecified: Secondary | ICD-10-CM | POA: Insufficient documentation

## 2015-12-03 DIAGNOSIS — R768 Other specified abnormal immunological findings in serum: Secondary | ICD-10-CM | POA: Insufficient documentation

## 2015-12-03 MED ORDER — VITAMIN D (ERGOCALCIFEROL) 1.25 MG (50000 UNIT) PO CAPS: ORAL_CAPSULE | ORAL | 0 refills | Status: DC

## 2015-12-03 MED ORDER — OVER THE COUNTER MEDICATION: 99 refills | Status: DC

## 2015-12-03 NOTE — Progress Notes (Signed)
-   Clinical Significance: Antinuclear antibodies are associated with rheumatic diseases including Systemic Lupus Erythematous (SLE), mixed connective tissue disease, Sjogren's syndrome, scleroderma, polymyositis, CREST syndrome, and neurologic SLE. Further work-up by a rheumatology specialist may be useful.

## 2015-12-03 NOTE — Progress Notes (Signed)

## 2015-12-13 ENCOUNTER — Ambulatory Visit: Payer: Medicaid Other | Attending: Pain Medicine | Admitting: Pain Medicine

## 2015-12-13 ENCOUNTER — Encounter: Payer: Self-pay | Admitting: Pain Medicine

## 2015-12-13 VITALS — BP 115/84 | HR 97 | Temp 98.3°F | Resp 16 | Ht 66.0 in | Wt 201.0 lb

## 2015-12-13 DIAGNOSIS — F1290 Cannabis use, unspecified, uncomplicated: Secondary | ICD-10-CM

## 2015-12-13 DIAGNOSIS — Z9889 Other specified postprocedural states: Secondary | ICD-10-CM | POA: Diagnosis not present

## 2015-12-13 DIAGNOSIS — F319 Bipolar disorder, unspecified: Secondary | ICD-10-CM

## 2015-12-13 DIAGNOSIS — F17210 Nicotine dependence, cigarettes, uncomplicated: Secondary | ICD-10-CM | POA: Insufficient documentation

## 2015-12-13 DIAGNOSIS — F1111 Opioid abuse, in remission: Secondary | ICD-10-CM

## 2015-12-13 DIAGNOSIS — Z888 Allergy status to other drugs, medicaments and biological substances status: Secondary | ICD-10-CM | POA: Diagnosis not present

## 2015-12-13 DIAGNOSIS — M545 Low back pain, unspecified: Secondary | ICD-10-CM

## 2015-12-13 DIAGNOSIS — M170 Bilateral primary osteoarthritis of knee: Secondary | ICD-10-CM | POA: Diagnosis not present

## 2015-12-13 DIAGNOSIS — M25529 Pain in unspecified elbow: Secondary | ICD-10-CM | POA: Diagnosis not present

## 2015-12-13 DIAGNOSIS — Z5900 Homelessness unspecified: Secondary | ICD-10-CM

## 2015-12-13 DIAGNOSIS — R892 Abnormal level of other drugs, medicaments and biological substances in specimens from other organs, systems and tissues: Secondary | ICD-10-CM

## 2015-12-13 DIAGNOSIS — M069 Rheumatoid arthritis, unspecified: Secondary | ICD-10-CM | POA: Insufficient documentation

## 2015-12-13 DIAGNOSIS — F1990 Other psychoactive substance use, unspecified, uncomplicated: Secondary | ICD-10-CM

## 2015-12-13 DIAGNOSIS — M25571 Pain in right ankle and joints of right foot: Secondary | ICD-10-CM | POA: Diagnosis not present

## 2015-12-13 DIAGNOSIS — E669 Obesity, unspecified: Secondary | ICD-10-CM | POA: Diagnosis not present

## 2015-12-13 DIAGNOSIS — G8929 Other chronic pain: Secondary | ICD-10-CM | POA: Diagnosis not present

## 2015-12-13 DIAGNOSIS — M25562 Pain in left knee: Secondary | ICD-10-CM

## 2015-12-13 DIAGNOSIS — D573 Sickle-cell trait: Secondary | ICD-10-CM | POA: Insufficient documentation

## 2015-12-13 DIAGNOSIS — Z79899 Other long term (current) drug therapy: Secondary | ICD-10-CM | POA: Insufficient documentation

## 2015-12-13 DIAGNOSIS — M25572 Pain in left ankle and joints of left foot: Secondary | ICD-10-CM

## 2015-12-13 DIAGNOSIS — Z302 Encounter for sterilization: Secondary | ICD-10-CM | POA: Diagnosis not present

## 2015-12-13 DIAGNOSIS — M25561 Pain in right knee: Secondary | ICD-10-CM

## 2015-12-13 NOTE — Progress Notes (Signed)
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 Return if symptoms worsen or fail to improve.  No future appointments.  Primary Care Physician: [facility] Department Location: [facility] Outpatient Pain Management Facility Note by: Sydnee Levans. Laban Emperor, M.D, DABA, DABAPM, DABPM, DABIPP, FIPP  Pain Score Disclaimer: We use the NRS-11 scale. This is a self-reported, subjective measurement of pain severity with only modest accuracy. It is used primarily to identify changes within a particular patient. It must be understood that outpatient pain scales are significantly less accurate that those used for research, where they can be applied under ideal  controlled circumstances with minimal exposure to variables. In reality, the score is likely to be a combination of pain intensity and pain affect, where pain affect describes the degree of emotional arousal or changes in action readiness  caused by the sensory experience of pain. Factors such as social and work situation, setting, emotional state, anxiety levels, expectation, and prior pain experience may influence pain perception and show large inter-individual differences that may also be affected by time variables.  Patient instructions provided during this appointment: Patient Instructions  Pain Management Discharge Instructions  General Discharge Instructions :  If you need to reach your doctor call: Monday-Friday 8:00 am - 4:00 pm at [phone] or toll free [phone].  After clinic hours [phone] to have operator reach doctor.  Bring all of your medication bottles to all your appointments in the pain clinic.  To cancel or reschedule your appointment with Pain Management please remember to call 24 hours in advance to avoid a fee.  Refer to the educational materials which you have been given on: General Risks, I had my Procedure. Discharge Instructions, Post Sedation.  Post Procedure Instructions:  The drugs you were given will stay in your system until tomorrow, so for the next 24 hours you should not drive, make any legal decisions or drink any alcoholic beverages.  You may eat anything you prefer, but it is better to start with liquids then soups and crackers, and gradually work up to solid foods.  Please notify your doctor immediately if you have any unusual bleeding, trouble breathing or pain that is not related to your normal pain.  Depending on the type of procedure that was done, some parts of your body may feel week and/or numb.  This usually clears up by tonight or the next day.  Walk with the use of an assistive device or accompanied by an adult for the 24 hours.  You may use ice on the affected area for the first 24 hours.  Put ice in a Ziploc bag and cover with a towel and place against area 15 minutes on 15 minutes off.  You may switch to heat after 24 hours.

## 2015-12-13 NOTE — Progress Notes (Signed)
Safety precautions to be maintained throughout the outpatient stay will include: orient to surroundings, keep bed in low position, maintain call bell within reach at all times, provide assistance with transfer out of bed and ambulation.  

## 2015-12-13 NOTE — Patient Instructions (Signed)

## 2015-12-17 NOTE — Progress Notes (Signed)
NOTE: This forensic urine drug screen (UDS) test was conducted using a state-of-the-art ultra high performance liquid chromatography and mass spectrometry system (UPLC/MS-MS), the most sophisticated and accurate method available. UPLC/MS-MS is 1,000 times more precise and accurate than standard gas chromatography and mass spectrometry (GC/MS). This system can analyze 26 drug categories and 180 drug compounds.  Positive, undisclosed use of illicit substance (Cannabinoids). Our program has a "Zero Tolerance" for the use of illicit substances. As a consequence of the results obtained on this test, we will no longer offer controlled substances as a therapeutic option for this patient.

## 2015-12-21 ENCOUNTER — Emergency Department: Payer: Medicaid Other

## 2015-12-21 ENCOUNTER — Emergency Department
Admission: EM | Admit: 2015-12-21 | Discharge: 2015-12-21 | Disposition: A | Discharge status: Home / Self Care | Payer: Medicaid Other | Source: Home / Self Care | Attending: Emergency Medicine | Admitting: Emergency Medicine

## 2015-12-21 ENCOUNTER — Emergency Department
Admission: EM | Admit: 2015-12-21 | Discharge: 2015-12-21 | Disposition: A | Discharge status: Home / Self Care | Payer: Medicaid Other | Attending: Emergency Medicine | Admitting: Emergency Medicine

## 2015-12-21 ENCOUNTER — Encounter: Payer: Self-pay | Admitting: Emergency Medicine

## 2015-12-21 DIAGNOSIS — Y9389 Activity, other specified: Secondary | ICD-10-CM | POA: Diagnosis not present

## 2015-12-21 DIAGNOSIS — F17210 Nicotine dependence, cigarettes, uncomplicated: Secondary | ICD-10-CM | POA: Insufficient documentation

## 2015-12-21 DIAGNOSIS — W228XXA Striking against or struck by other objects, initial encounter: Secondary | ICD-10-CM

## 2015-12-21 DIAGNOSIS — Y939 Activity, unspecified: Secondary | ICD-10-CM

## 2015-12-21 DIAGNOSIS — Y990 Civilian activity done for income or pay: Secondary | ICD-10-CM | POA: Insufficient documentation

## 2015-12-21 DIAGNOSIS — Y999 Unspecified external cause status: Secondary | ICD-10-CM | POA: Insufficient documentation

## 2015-12-21 DIAGNOSIS — Y929 Unspecified place or not applicable: Secondary | ICD-10-CM | POA: Insufficient documentation

## 2015-12-21 DIAGNOSIS — S90112A Contusion of left great toe without damage to nail, initial encounter: Secondary | ICD-10-CM | POA: Diagnosis not present

## 2015-12-21 DIAGNOSIS — M79675 Pain in left toe(s): Secondary | ICD-10-CM

## 2015-12-21 DIAGNOSIS — S99922A Unspecified injury of left foot, initial encounter: Secondary | ICD-10-CM | POA: Diagnosis present

## 2015-12-21 DIAGNOSIS — Z79899 Other long term (current) drug therapy: Secondary | ICD-10-CM | POA: Insufficient documentation

## 2015-12-21 DIAGNOSIS — Z791 Long term (current) use of non-steroidal anti-inflammatories (NSAID): Secondary | ICD-10-CM | POA: Insufficient documentation

## 2015-12-21 DIAGNOSIS — Z5321 Procedure and treatment not carried out due to patient leaving prior to being seen by health care provider: Secondary | ICD-10-CM

## 2015-12-21 MED ORDER — NAPROXEN 500 MG PO TABS
500.0000 mg | ORAL_TABLET | Freq: Once | ORAL | Status: AC
  Administered 2015-12-21: 500 mg via ORAL

## 2015-12-21 MED ORDER — NAPROXEN 500 MG PO TABS
ORAL_TABLET | ORAL | Status: AC
  Filled 2015-12-21: qty 1

## 2015-12-21 MED ORDER — NAPROXEN 500 MG PO TABS: 500.0000 mg | ORAL_TABLET | Freq: Two times a day (BID) | ORAL | 0 refills | Status: DC

## 2015-12-21 NOTE — ED Triage Notes (Signed)
States she hit her left great toe on heavy machine/door 2 days ago  conts to have pain and swelling

## 2015-12-21 NOTE — ED Notes (Signed)
States she hit her left great toe about 2 days ago.. conts to have pain with min swelling to foot   States she hit it on a heavy metal cabinet
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
## 2015-12-21 NOTE — ED Triage Notes (Signed)
Pt with left great toe swelling and pain x2 days. Pt reports taking tylenol and ibu with no relief.
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
## 2016-01-01 ENCOUNTER — Ambulatory Visit: Payer: Medicaid Other | Admitting: Pain Medicine

## 2016-09-05 ENCOUNTER — Encounter (HOSPITAL_COMMUNITY): Payer: Self-pay | Admitting: Emergency Medicine

## 2016-09-05 ENCOUNTER — Emergency Department (HOSPITAL_COMMUNITY)
Admission: EM | Admit: 2016-09-05 | Discharge: 2016-09-06 | Disposition: A | Discharge status: Home / Self Care | Payer: Medicaid Other | Attending: Emergency Medicine | Admitting: Emergency Medicine

## 2016-09-05 DIAGNOSIS — R109 Unspecified abdominal pain: Secondary | ICD-10-CM | POA: Diagnosis present

## 2016-09-05 DIAGNOSIS — N390 Urinary tract infection, site not specified: Secondary | ICD-10-CM | POA: Insufficient documentation

## 2016-09-05 DIAGNOSIS — F17210 Nicotine dependence, cigarettes, uncomplicated: Secondary | ICD-10-CM | POA: Diagnosis not present

## 2016-09-05 DIAGNOSIS — D573 Sickle-cell trait: Secondary | ICD-10-CM | POA: Diagnosis not present

## 2016-09-05 MED ORDER — ONDANSETRON 4 MG PO TBDP
4.0000 mg | ORAL_TABLET | Freq: Once | ORAL | Status: AC | PRN
  Administered 2016-09-05: 4 mg via ORAL
  Filled 2016-09-05: qty 1

## 2016-09-05 NOTE — ED Notes (Signed)
Explained to pt that either her or her daughter would have to be seen first then the other one once discharged. Pt explained understanding and said that she wanted her daughter to be seen first but she went ahead and checked herself In.

## 2016-09-05 NOTE — ED Triage Notes (Signed)
Patient states she has been sick on and off for awhile, she states that she is having a hard time keeping food down.  She has been having nausea and vomiting with abdominal pain.  She has been taking ibuprofen with no relief.  She states she has had blood in her urine with frequency and odor to urine.

## 2016-09-05 NOTE — ED Notes (Signed)
Pt refuses to be triaged until her daughter has been seen

## 2016-09-06 LAB — COMPREHENSIVE METABOLIC PANEL
ALBUMIN: 3.5 g/dL (ref 3.5–5.0)
ALT: 10 U/L — AB (ref 14–54)
AST: 19 U/L (ref 15–41)
Alkaline Phosphatase: 64 U/L (ref 38–126)
Anion gap: 6 (ref 5–15)
BUN: 5 mg/dL — AB (ref 6–20)
CHLORIDE: 106 mmol/L (ref 101–111)
CO2: 26 mmol/L (ref 22–32)
Calcium: 8.8 mg/dL — ABNORMAL LOW (ref 8.9–10.3)
Creatinine, Ser: 1.04 mg/dL — ABNORMAL HIGH (ref 0.44–1.00)
GFR calc non Af Amer: >60 mL/min (ref >60)
GLUCOSE: 91 mg/dL (ref 65–99)
Potassium: 3.5 mmol/L (ref 3.5–5.1)
SODIUM: 138 mmol/L (ref 135–145)
Total Bilirubin: 0.4 mg/dL (ref 0.3–1.2)
Total Protein: 7.1 g/dL (ref 6.5–8.1)

## 2016-09-06 LAB — URINALYSIS, ROUTINE W REFLEX MICROSCOPIC
Bilirubin Urine: NEGATIVE
Glucose, UA: NEGATIVE mg/dL
Hgb urine dipstick: NEGATIVE
KETONES UR: NEGATIVE mg/dL
Nitrite: POSITIVE — AB
PH: 5 (ref 5.0–8.0)
PROTEIN: NEGATIVE mg/dL
Specific Gravity, Urine: 1.013 (ref 1.005–1.030)

## 2016-09-06 LAB — POC URINE PREG, ED: Preg Test, Ur: NEGATIVE

## 2016-09-06 LAB — CBC
HCT: 32.1 % — ABNORMAL LOW (ref 36.0–46.0)
Hemoglobin: 10.4 g/dL — ABNORMAL LOW (ref 12.0–15.0)
MCH: 23.9 pg — AB (ref 26.0–34.0)
MCHC: 32.4 g/dL (ref 30.0–36.0)
MCV: 73.8 fL — AB (ref 78.0–100.0)
Platelets: 284 10*3/uL (ref 150–400)
RBC: 4.35 MIL/uL (ref 3.87–5.11)
RDW: 15.4 % (ref 11.5–15.5)
WBC: 4.5 10*3/uL (ref 4.0–10.5)

## 2016-09-06 LAB — LIPASE, BLOOD: LIPASE: 32 U/L (ref 11–51)

## 2016-09-06 MED ORDER — NAPROXEN 250 MG PO TABS
500.0000 mg | ORAL_TABLET | Freq: Once | ORAL | Status: AC
  Administered 2016-09-06: 500 mg via ORAL
  Filled 2016-09-06: qty 2

## 2016-09-06 MED ORDER — PROMETHAZINE HCL 25 MG PO TABS
25.0000 mg | ORAL_TABLET | ORAL | Status: AC
  Administered 2016-09-06: 25 mg via ORAL
  Filled 2016-09-06: qty 1

## 2016-09-06 MED ORDER — CEPHALEXIN 250 MG PO CAPS
500.0000 mg | ORAL_CAPSULE | Freq: Once | ORAL | Status: AC
  Administered 2016-09-06: 500 mg via ORAL
  Filled 2016-09-06: qty 2

## 2016-09-06 MED ORDER — PHENAZOPYRIDINE HCL 200 MG PO TABS
200.0000 mg | ORAL_TABLET | Freq: Three times a day (TID) | ORAL | 0 refills | Status: DC
Start: 2016-09-06 — End: 2018-10-22

## 2016-09-06 MED ORDER — PHENAZOPYRIDINE HCL 100 MG PO TABS
200.0000 mg | ORAL_TABLET | Freq: Once | ORAL | Status: AC
  Administered 2016-09-06: 200 mg via ORAL
  Filled 2016-09-06: qty 2

## 2016-09-06 MED ORDER — PROMETHAZINE HCL 25 MG PO TABS: 25.0000 mg | ORAL_TABLET | Freq: Four times a day (QID) | ORAL | 0 refills | Status: DC | PRN

## 2016-09-06 MED ORDER — NAPROXEN 500 MG PO TABS: 500.0000 mg | ORAL_TABLET | Freq: Two times a day (BID) | ORAL | 0 refills | Status: DC

## 2016-09-06 MED ORDER — CEPHALEXIN 500 MG PO CAPS: 500.0000 mg | ORAL_CAPSULE | Freq: Two times a day (BID) | ORAL | 0 refills | Status: DC

## 2016-09-06 NOTE — Discharge Instructions (Signed)
Take Keflex as prescribed until finished. You may take Pyridium for abdominal pain and bladder spasms. Supplement this with naproxen as needed. You may take Phenergan for nausea/vomiting. Should you desire STD testing for general good health practice, follow-up with the health department. You may return to the emergency department for new or concerning symptoms.

## 2016-09-06 NOTE — ED Notes (Signed)
Pt returned to lobby at this time stating she was outside getting children situated.

## 2016-09-06 NOTE — ED Notes (Addendum)
Called pt for room- no answer. Will check to see if she is still with daughter in peds
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
## 2016-09-06 NOTE — ED Notes (Signed)
ED Provider at bedside. 

## 2016-09-06 NOTE — ED Notes (Signed)
Called for pt x 3- daughter d/c from peds and pt not in adult lobby or pediatric lobby.

## 2016-09-16 ENCOUNTER — Encounter (HOSPITAL_COMMUNITY): Payer: Self-pay

## 2016-09-16 ENCOUNTER — Emergency Department (HOSPITAL_COMMUNITY)
Admission: EM | Admit: 2016-09-16 | Discharge: 2016-09-16 | Discharge status: Against Medical Advice | Payer: Medicaid Other | Attending: Emergency Medicine | Admitting: Emergency Medicine

## 2016-09-16 ENCOUNTER — Emergency Department (HOSPITAL_COMMUNITY): Admit: 2016-09-16 | Payer: Medicaid Other

## 2016-09-16 DIAGNOSIS — Z79899 Other long term (current) drug therapy: Secondary | ICD-10-CM | POA: Diagnosis not present

## 2016-09-16 DIAGNOSIS — F17210 Nicotine dependence, cigarettes, uncomplicated: Secondary | ICD-10-CM | POA: Diagnosis not present

## 2016-09-16 DIAGNOSIS — M25562 Pain in left knee: Secondary | ICD-10-CM

## 2016-09-16 DIAGNOSIS — M25532 Pain in left wrist: Secondary | ICD-10-CM | POA: Diagnosis not present

## 2016-09-16 DIAGNOSIS — M79605 Pain in left leg: Secondary | ICD-10-CM | POA: Diagnosis present

## 2016-09-16 MED ORDER — IBUPROFEN 800 MG PO TABS: 800.0000 mg | ORAL_TABLET | Freq: Once | ORAL | Status: DC

## 2016-09-16 MED ORDER — PREDNISONE 20 MG PO TABS: ORAL_TABLET | ORAL | 0 refills | Status: DC

## 2016-09-16 MED ORDER — PREDNISONE 20 MG PO TABS: 60.0000 mg | ORAL_TABLET | Freq: Once | ORAL | Status: DC

## 2016-09-16 MED ORDER — KETOROLAC TROMETHAMINE 60 MG/2ML IM SOLN
15.0000 mg | Freq: Once | INTRAMUSCULAR | Status: DC
  Filled 2016-09-16: qty 2

## 2016-09-16 MED ORDER — ACETAMINOPHEN 500 MG PO TABS
1000.0000 mg | ORAL_TABLET | Freq: Once | ORAL | Status: DC
Start: 2016-09-16 — End: 2016-09-16
  Filled 2016-09-16: qty 2

## 2016-09-16 NOTE — ED Notes (Signed)
Pt states shes leaving AMA and refuses to sign the AMA form. She states she doesn't agree with the way the doctor isn't treating her pain so that's not AMA

## 2016-09-16 NOTE — Discharge Instructions (Signed)
Take 4 over the counter ibuprofen tablets 3 times a day or 2 over-the-counter naproxen tablets twice a day for pain. Also take tylenol 1000mg(2 extra strength) four times a day.    

## 2016-09-16 NOTE — Progress Notes (Signed)
Orthopedic Tech Progress Note Patient Details:  Stacey Howe 1980/04/11 [id number]  Ortho Devices Type of Ortho Device: Velcro wrist splint Ortho Device/Splint Location: LUE Ortho Device/Splint Interventions: Ordered, Application   Jennye Moccasin 09/16/2016, 6:32 PM
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
## 2016-09-16 NOTE — ED Notes (Signed)
Pt refused tylenol and toradol.  °

## 2016-09-16 NOTE — ED Notes (Signed)
Pt refusing medication and requesting to speak to dr. Adela Lank. Dr. Adela Lank at pt bedside

## 2016-09-16 NOTE — ED Notes (Signed)
Patient was in the bathroom.

## 2016-09-16 NOTE — ED Notes (Signed)
Pt given apple juice per Chelsea(RN)

## 2016-09-16 NOTE — ED Triage Notes (Signed)
Pt states she has hx of rheumatoid arthritis. She states she moved here from [place] 4 months ago and is out of meds. She primarily complains of pain in her left wrist and her left knee which is markedly swollen.

## 2016-11-15 ENCOUNTER — Emergency Department (HOSPITAL_COMMUNITY)
Admission: EM | Admit: 2016-11-15 | Discharge: 2016-11-15 | Disposition: A | Discharge status: Home / Self Care | Payer: Medicaid Other | Attending: Emergency Medicine | Admitting: Emergency Medicine

## 2016-11-15 ENCOUNTER — Encounter (HOSPITAL_COMMUNITY): Payer: Self-pay | Admitting: *Deleted

## 2016-11-15 DIAGNOSIS — N12 Tubulo-interstitial nephritis, not specified as acute or chronic: Secondary | ICD-10-CM | POA: Insufficient documentation

## 2016-11-15 DIAGNOSIS — F17210 Nicotine dependence, cigarettes, uncomplicated: Secondary | ICD-10-CM | POA: Insufficient documentation

## 2016-11-15 DIAGNOSIS — G8929 Other chronic pain: Secondary | ICD-10-CM | POA: Diagnosis not present

## 2016-11-15 DIAGNOSIS — Z791 Long term (current) use of non-steroidal anti-inflammatories (NSAID): Secondary | ICD-10-CM | POA: Insufficient documentation

## 2016-11-15 DIAGNOSIS — R1031 Right lower quadrant pain: Secondary | ICD-10-CM | POA: Diagnosis present

## 2016-11-15 LAB — COMPREHENSIVE METABOLIC PANEL
ALK PHOS: 73 U/L (ref 38–126)
ALT: 11 U/L — ABNORMAL LOW (ref 14–54)
ANION GAP: 8 (ref 5–15)
AST: 20 U/L (ref 15–41)
Albumin: 3.3 g/dL — ABNORMAL LOW (ref 3.5–5.0)
BILIRUBIN TOTAL: 0.6 mg/dL (ref 0.3–1.2)
BUN: 7 mg/dL (ref 6–20)
CALCIUM: 8.9 mg/dL (ref 8.9–10.3)
CO2: 25 mmol/L (ref 22–32)
Chloride: 104 mmol/L (ref 101–111)
Creatinine, Ser: 0.95 mg/dL (ref 0.44–1.00)
Glucose, Bld: 91 mg/dL (ref 65–99)
POTASSIUM: 3.6 mmol/L (ref 3.5–5.1)
Sodium: 137 mmol/L (ref 135–145)
TOTAL PROTEIN: 7.7 g/dL (ref 6.5–8.1)

## 2016-11-15 LAB — URINALYSIS, ROUTINE W REFLEX MICROSCOPIC
Bilirubin Urine: NEGATIVE
Glucose, UA: NEGATIVE mg/dL
Ketones, ur: NEGATIVE mg/dL
NITRITE: NEGATIVE
Protein, ur: 100 mg/dL — AB
Specific Gravity, Urine: 1.006 (ref 1.005–1.030)
pH: 6 (ref 5.0–8.0)

## 2016-11-15 LAB — CBC
HEMATOCRIT: 35.3 % — AB (ref 36.0–46.0)
HEMOGLOBIN: 11.3 g/dL — AB (ref 12.0–15.0)
MCH: 23.5 pg — ABNORMAL LOW (ref 26.0–34.0)
MCHC: 32 g/dL (ref 30.0–36.0)
MCV: 73.5 fL — ABNORMAL LOW (ref 78.0–100.0)
Platelets: 245 10*3/uL (ref 150–400)
RBC: 4.8 MIL/uL (ref 3.87–5.11)
RDW: 15.4 % (ref 11.5–15.5)
WBC: 21.3 10*3/uL — AB (ref 4.0–10.5)

## 2016-11-15 LAB — POC URINE PREG, ED: Preg Test, Ur: NEGATIVE

## 2016-11-15 LAB — LIPASE, BLOOD: Lipase: 20 U/L (ref 11–51)

## 2016-11-15 LAB — I-STAT CG4 LACTIC ACID, ED
Lactic Acid, Venous: 2.13 mmol/L (ref 0.5–1.9)
Lactic Acid, Venous: 0.81 mmol/L (ref 0.5–1.9)

## 2016-11-15 MED ORDER — KETOROLAC TROMETHAMINE 30 MG/ML IJ SOLN
30.0000 mg | Freq: Once | INTRAMUSCULAR | Status: AC
  Administered 2016-11-15: 30 mg via INTRAVENOUS
  Filled 2016-11-15: qty 1

## 2016-11-15 MED ORDER — PROMETHAZINE HCL 25 MG PO TABS: 25.0000 mg | ORAL_TABLET | Freq: Four times a day (QID) | ORAL | 0 refills | Status: DC | PRN

## 2016-11-15 MED ORDER — IBUPROFEN 600 MG PO TABS: 600.0000 mg | ORAL_TABLET | Freq: Four times a day (QID) | ORAL | 0 refills | Status: DC | PRN

## 2016-11-15 MED ORDER — SODIUM CHLORIDE 0.9 % IV BOLUS (SEPSIS)
1000.0000 mL | Freq: Once | INTRAVENOUS | Status: AC
  Administered 2016-11-15: 1000 mL via INTRAVENOUS

## 2016-11-15 MED ORDER — ONDANSETRON HCL 4 MG/2ML IJ SOLN
4.0000 mg | Freq: Once | INTRAMUSCULAR | Status: AC
  Administered 2016-11-15: 4 mg via INTRAVENOUS
  Filled 2016-11-15: qty 2

## 2016-11-15 MED ORDER — DEXTROSE 5 % IV SOLN
1.0000 g | Freq: Once | INTRAVENOUS | Status: AC
  Administered 2016-11-15: 1 g via INTRAVENOUS
  Filled 2016-11-15: qty 10

## 2016-11-15 MED ORDER — CEPHALEXIN 250 MG PO CAPS
500.0000 mg | ORAL_CAPSULE | Freq: Once | ORAL | Status: AC
Start: 2016-11-15 — End: 2016-11-15
  Administered 2016-11-15: 500 mg via ORAL
  Filled 2016-11-15: qty 2

## 2016-11-15 MED ORDER — CEPHALEXIN 500 MG PO CAPS: 500.0000 mg | ORAL_CAPSULE | Freq: Four times a day (QID) | ORAL | 0 refills | Status: DC
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
## 2016-11-15 NOTE — ED Triage Notes (Signed)
The pt is c/o rt flank pain since this am with n and vomiting  lmp due  She has had a bad odor to her urine and frequency

## 2016-11-17 LAB — URINE CULTURE

## 2016-11-18 ENCOUNTER — Encounter: Payer: Self-pay | Admitting: Pain Medicine

## 2017-02-18 HISTORY — PX: MANDIBLE FRACTURE SURGERY: SHX706

## 2017-11-26 ENCOUNTER — Emergency Department (HOSPITAL_COMMUNITY)
Admission: EM | Admit: 2017-11-26 | Discharge: 2017-11-26 | Disposition: A | Discharge status: Home / Self Care | Payer: Medicaid Other | Attending: Emergency Medicine | Admitting: Emergency Medicine

## 2017-11-26 ENCOUNTER — Encounter (HOSPITAL_COMMUNITY): Payer: Self-pay | Admitting: Emergency Medicine

## 2017-11-26 ENCOUNTER — Other Ambulatory Visit: Payer: Self-pay

## 2017-11-26 DIAGNOSIS — R1110 Vomiting, unspecified: Secondary | ICD-10-CM | POA: Diagnosis not present

## 2017-11-26 DIAGNOSIS — Z5321 Procedure and treatment not carried out due to patient leaving prior to being seen by health care provider: Secondary | ICD-10-CM | POA: Diagnosis not present

## 2017-11-26 LAB — CBC
HEMATOCRIT: 38.9 % (ref 36.0–46.0)
HEMOGLOBIN: 12.5 g/dL (ref 12.0–15.0)
MCH: 24.2 pg — AB (ref 26.0–34.0)
MCHC: 32.1 g/dL (ref 30.0–36.0)
MCV: 75.2 fL — ABNORMAL LOW (ref 80.0–100.0)
Platelets: 285 10*3/uL (ref 150–400)
RBC: 5.17 MIL/uL — ABNORMAL HIGH (ref 3.87–5.11)
RDW: 14.2 % (ref 11.5–15.5)
WBC: 6.7 10*3/uL (ref 4.0–10.5)
nRBC: 0 % (ref 0.0–0.2)

## 2017-11-26 LAB — I-STAT BETA HCG BLOOD, ED (MC, WL, AP ONLY): I-stat hCG, quantitative: <5 m[IU]/mL (ref <5)

## 2017-11-26 LAB — COMPREHENSIVE METABOLIC PANEL
ALBUMIN: 4.3 g/dL (ref 3.5–5.0)
ALT: 14 U/L (ref 0–44)
AST: 19 U/L (ref 15–41)
Alkaline Phosphatase: 68 U/L (ref 38–126)
Anion gap: 10 (ref 5–15)
BILIRUBIN TOTAL: 0.7 mg/dL (ref 0.3–1.2)
BUN: 7 mg/dL (ref 6–20)
CHLORIDE: 104 mmol/L (ref 98–111)
CO2: 24 mmol/L (ref 22–32)
Calcium: 9.9 mg/dL (ref 8.9–10.3)
Creatinine, Ser: 0.84 mg/dL (ref 0.44–1.00)
GFR calc Af Amer: >60 mL/min (ref >60)
GFR calc non Af Amer: >60 mL/min (ref >60)
GLUCOSE: 111 mg/dL — AB (ref 70–99)
POTASSIUM: 3.3 mmol/L — AB (ref 3.5–5.1)
Sodium: 138 mmol/L (ref 135–145)
TOTAL PROTEIN: 8.2 g/dL — AB (ref 6.5–8.1)

## 2017-11-26 LAB — LIPASE, BLOOD: Lipase: 30 U/L (ref 11–51)

## 2017-11-26 MED ORDER — ONDANSETRON HCL 4 MG/2ML IJ SOLN
4.0000 mg | Freq: Once | INTRAMUSCULAR | Status: AC | PRN
  Administered 2017-11-26: 4 mg via INTRAVENOUS
  Filled 2017-11-26: qty 2

## 2017-11-26 NOTE — ED Triage Notes (Signed)
Pt reports vomiting that started this afternoon. Pt actively vomiting in triage, yellow emesis. Also diarrhea. Endorses mid abd pain. Pt requesting zofran through an IV. Also lying on the floor in triage in room because she states the chair isn't comfortable.

## 2017-11-26 NOTE — ED Notes (Signed)
Pt notified staff that she was leaving due to the wait time. Staff attempted to get the patient to stay, patient still decided to leave.

## 2018-09-22 ENCOUNTER — Emergency Department
Admission: EM | Admit: 2018-09-22 | Discharge: 2018-09-22 | Disposition: A | Discharge status: Home / Self Care | Payer: Medicaid Other | Attending: Emergency Medicine | Admitting: Emergency Medicine

## 2018-09-22 ENCOUNTER — Other Ambulatory Visit: Payer: Self-pay

## 2018-09-22 DIAGNOSIS — N390 Urinary tract infection, site not specified: Secondary | ICD-10-CM | POA: Diagnosis not present

## 2018-09-22 DIAGNOSIS — F319 Bipolar disorder, unspecified: Secondary | ICD-10-CM | POA: Insufficient documentation

## 2018-09-22 DIAGNOSIS — R300 Dysuria: Secondary | ICD-10-CM | POA: Diagnosis present

## 2018-09-22 DIAGNOSIS — Z79899 Other long term (current) drug therapy: Secondary | ICD-10-CM | POA: Diagnosis not present

## 2018-09-22 DIAGNOSIS — F17210 Nicotine dependence, cigarettes, uncomplicated: Secondary | ICD-10-CM | POA: Diagnosis not present

## 2018-09-22 LAB — URINALYSIS, ROUTINE W REFLEX MICROSCOPIC
Bilirubin Urine: NEGATIVE
Glucose, UA: NEGATIVE mg/dL
Hgb urine dipstick: NEGATIVE
Ketones, ur: NEGATIVE mg/dL
Nitrite: POSITIVE — AB
Protein, ur: NEGATIVE mg/dL
Specific Gravity, Urine: 1.012 (ref 1.005–1.030)
pH: 5 (ref 5.0–8.0)

## 2018-09-22 LAB — COMPREHENSIVE METABOLIC PANEL
ALT: 16 U/L (ref 0–44)
AST: 22 U/L (ref 15–41)
Albumin: 3.6 g/dL (ref 3.5–5.0)
Alkaline Phosphatase: 66 U/L (ref 38–126)
Anion gap: 6 (ref 5–15)
BUN: 8 mg/dL (ref 6–20)
CO2: 24 mmol/L (ref 22–32)
Calcium: 8.4 mg/dL — ABNORMAL LOW (ref 8.9–10.3)
Chloride: 106 mmol/L (ref 98–111)
Creatinine, Ser: 0.77 mg/dL (ref 0.44–1.00)
GFR calc Af Amer: >60 mL/min (ref >60)
GFR calc non Af Amer: >60 mL/min (ref >60)
Glucose, Bld: 86 mg/dL (ref 70–99)
Potassium: 4 mmol/L (ref 3.5–5.1)
Sodium: 136 mmol/L (ref 135–145)
Total Bilirubin: 0.5 mg/dL (ref 0.3–1.2)
Total Protein: 7.5 g/dL (ref 6.5–8.1)

## 2018-09-22 LAB — CBC WITH DIFFERENTIAL/PLATELET
Abs Immature Granulocytes: 0.01 10*3/uL (ref 0.00–0.07)
Basophils Absolute: 0 10*3/uL (ref 0.0–0.1)
Basophils Relative: 1 %
Eosinophils Absolute: 0 10*3/uL (ref 0.0–0.5)
Eosinophils Relative: 1 %
HCT: 35.9 % — ABNORMAL LOW (ref 36.0–46.0)
Hemoglobin: 11.8 g/dL — ABNORMAL LOW (ref 12.0–15.0)
Immature Granulocytes: 0 %
Lymphocytes Relative: 37 %
Lymphs Abs: 1.9 10*3/uL (ref 0.7–4.0)
MCH: 25.5 pg — ABNORMAL LOW (ref 26.0–34.0)
MCHC: 32.9 g/dL (ref 30.0–36.0)
MCV: 77.5 fL — ABNORMAL LOW (ref 80.0–100.0)
Monocytes Absolute: 0.5 10*3/uL (ref 0.1–1.0)
Monocytes Relative: 9 %
Neutro Abs: 2.8 10*3/uL (ref 1.7–7.7)
Neutrophils Relative %: 52 %
Platelets: 228 10*3/uL (ref 150–400)
RBC: 4.63 MIL/uL (ref 3.87–5.11)
RDW: 15.5 % (ref 11.5–15.5)
WBC: 5.3 10*3/uL (ref 4.0–10.5)
nRBC: 0 % (ref 0.0–0.2)

## 2018-09-22 LAB — POCT PREGNANCY, URINE: Preg Test, Ur: NEGATIVE

## 2018-09-22 MED ORDER — ONDANSETRON HCL 4 MG/2ML IJ SOLN
4.0000 mg | Freq: Once | INTRAMUSCULAR | Status: AC
  Administered 2018-09-22: 4 mg via INTRAVENOUS
  Filled 2018-09-22: qty 2

## 2018-09-22 MED ORDER — CEPHALEXIN 500 MG PO CAPS: 500.0000 mg | ORAL_CAPSULE | Freq: Three times a day (TID) | ORAL | 0 refills | Status: DC

## 2018-09-22 MED ORDER — KETOROLAC TROMETHAMINE 10 MG PO TABS: 10.0000 mg | ORAL_TABLET | Freq: Four times a day (QID) | ORAL | 0 refills | Status: DC | PRN

## 2018-09-22 MED ORDER — SODIUM CHLORIDE 0.9 % IV BOLUS
1000.0000 mL | Freq: Once | INTRAVENOUS | Status: AC
  Administered 2018-09-22: 1000 mL via INTRAVENOUS

## 2018-09-22 MED ORDER — KETOROLAC TROMETHAMINE 30 MG/ML IJ SOLN
15.0000 mg | Freq: Once | INTRAMUSCULAR | Status: AC
  Administered 2018-09-22: 15 mg via INTRAVENOUS
  Filled 2018-09-22: qty 1

## 2018-09-22 MED ORDER — SODIUM CHLORIDE 0.9 % IV SOLN
1.0000 g | Freq: Once | INTRAVENOUS | Status: AC
  Administered 2018-09-22: 1 g via INTRAVENOUS
  Filled 2018-09-22: qty 10

## 2018-09-22 NOTE — ED Triage Notes (Signed)
Pt arrives to ED via POV from home with c/o left-sided lower back pain and "my pee stinking so much" x1 week. Pt states she has an upcoming appt with her PCP this week, but presents d/t worsening s/x's. Pt reports malodorous urine, painful and frequent urination. Pt reports h/x of same. Pt is A&O, in NAD; RR even, regular and unlabored. Pt also states she trying "to get into a study at [place] for women that have more than 10 urinary tract infections per year".
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
## 2018-09-24 LAB — URINE CULTURE: Culture: >=100000 — AB

## 2018-10-22 ENCOUNTER — Other Ambulatory Visit: Payer: Self-pay

## 2018-10-22 ENCOUNTER — Ambulatory Visit
Admission: EM | Admit: 2018-10-22 | Discharge: 2018-10-22 | Disposition: A | Discharge status: Home / Self Care | Payer: Medicaid Other | Attending: Family Medicine | Admitting: Family Medicine

## 2018-10-22 DIAGNOSIS — B3731 Acute candidiasis of vulva and vagina: Secondary | ICD-10-CM

## 2018-10-22 DIAGNOSIS — N760 Acute vaginitis: Secondary | ICD-10-CM

## 2018-10-22 DIAGNOSIS — M2550 Pain in unspecified joint: Secondary | ICD-10-CM

## 2018-10-22 DIAGNOSIS — B9689 Other specified bacterial agents as the cause of diseases classified elsewhere: Secondary | ICD-10-CM | POA: Insufficient documentation

## 2018-10-22 DIAGNOSIS — B373 Candidiasis of vulva and vagina: Secondary | ICD-10-CM

## 2018-10-22 LAB — WET PREP, GENITAL
Sperm: NONE SEEN
Trich, Wet Prep: NONE SEEN

## 2018-10-22 MED ORDER — FLUCONAZOLE 150 MG PO TABS: ORAL_TABLET | ORAL | 0 refills | Status: DC

## 2018-10-22 MED ORDER — PREDNISONE 10 MG (21) PO TBPK: ORAL_TABLET | Freq: Every day | ORAL | 0 refills | Status: DC

## 2018-10-22 MED ORDER — METRONIDAZOLE 0.75 % VA GEL: 1.0000 | Freq: Two times a day (BID) | VAGINAL | 0 refills | Status: DC

## 2018-10-22 NOTE — Discharge Instructions (Addendum)
It was very nice seeing you today in clinic. Thank you for entrusting me with your care.   You have BV and a yeast infection. I have sent in medications to treat these conditions.   Regarding your RA, you indicated that prednisone tapers have helped in the past. Will send prescription for the taper. You need to establish care with local rheumatologist to discuss ongoing care and management. I have provided you with the name and number of an excellent local provider. You will need to call for any appointment.   If your symptoms/condition worsens, please seek follow up care either here or in the ER. Please remember, our [facility] providers are "right here with you" when you need [place].   Again, it was my pleasure to take care of you today. Thank you for choosing our clinic. I hope that you start to feel better quickly.   Honor Loh, MSN, APRN, FNP-C, CEN Advanced Practice Provider [facility]
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
## 2018-11-05 ENCOUNTER — Emergency Department
Admission: EM | Admit: 2018-11-05 | Discharge: 2018-11-05 | Disposition: A | Discharge status: Home / Self Care | Payer: Medicaid Other | Attending: Emergency Medicine | Admitting: Emergency Medicine

## 2018-11-05 ENCOUNTER — Other Ambulatory Visit: Payer: Self-pay

## 2018-11-05 DIAGNOSIS — F17210 Nicotine dependence, cigarettes, uncomplicated: Secondary | ICD-10-CM | POA: Diagnosis not present

## 2018-11-05 DIAGNOSIS — R1110 Vomiting, unspecified: Secondary | ICD-10-CM | POA: Insufficient documentation

## 2018-11-05 DIAGNOSIS — Z5321 Procedure and treatment not carried out due to patient leaving prior to being seen by health care provider: Secondary | ICD-10-CM | POA: Diagnosis not present

## 2018-11-05 DIAGNOSIS — R109 Unspecified abdominal pain: Secondary | ICD-10-CM | POA: Diagnosis not present

## 2018-11-05 LAB — CBC
HCT: 41.7 % (ref 36.0–46.0)
Hemoglobin: 13.8 g/dL (ref 12.0–15.0)
MCH: 25.7 pg — ABNORMAL LOW (ref 26.0–34.0)
MCHC: 33.1 g/dL (ref 30.0–36.0)
MCV: 77.5 fL — ABNORMAL LOW (ref 80.0–100.0)
Platelets: 248 10*3/uL (ref 150–400)
RBC: 5.38 MIL/uL — ABNORMAL HIGH (ref 3.87–5.11)
RDW: 15.9 % — ABNORMAL HIGH (ref 11.5–15.5)
WBC: 11.1 10*3/uL — ABNORMAL HIGH (ref 4.0–10.5)
nRBC: 0 % (ref 0.0–0.2)

## 2018-11-05 LAB — COMPREHENSIVE METABOLIC PANEL
ALT: 13 U/L (ref 0–44)
AST: 21 U/L (ref 15–41)
Albumin: 4 g/dL (ref 3.5–5.0)
Alkaline Phosphatase: 65 U/L (ref 38–126)
Anion gap: 13 (ref 5–15)
BUN: 6 mg/dL (ref 6–20)
CO2: 24 mmol/L (ref 22–32)
Calcium: 9.5 mg/dL (ref 8.9–10.3)
Chloride: 99 mmol/L (ref 98–111)
Creatinine, Ser: 0.91 mg/dL (ref 0.44–1.00)
GFR calc Af Amer: >60 mL/min (ref >60)
GFR calc non Af Amer: >60 mL/min (ref >60)
Glucose, Bld: 123 mg/dL — ABNORMAL HIGH (ref 70–99)
Potassium: 3.4 mmol/L — ABNORMAL LOW (ref 3.5–5.1)
Sodium: 136 mmol/L (ref 135–145)
Total Bilirubin: 0.8 mg/dL (ref 0.3–1.2)
Total Protein: 8.3 g/dL — ABNORMAL HIGH (ref 6.5–8.1)

## 2018-11-05 LAB — GLUCOSE, CAPILLARY: Glucose-Capillary: 108 mg/dL — ABNORMAL HIGH (ref 70–99)

## 2018-11-05 LAB — LIPASE, BLOOD: Lipase: 18 U/L (ref 11–51)

## 2018-11-05 NOTE — ED Notes (Signed)
First Nurse Note; pt in via [facility] from home with complaints sudden onset abdominal and emesis approximately 1 hour ago.  Arrives with 18G left AC; 4mg  zofran given prior to arrival.  Per EMS, vitals WDL.

## 2018-11-05 NOTE — ED Notes (Signed)
Patient thrashing around in wheelchair, moaning loudly, asks this RN to plug her phone up for her because she cannot walk.  This RN lets her know that she will be taken back to be triaged soon.  Patient then ambulates without difficulty to another area of lobby to be able to plug up phone.  Pt then yells out, "My IV just came out, Can I get some help?"  Pt no longer with PIV access.

## 2018-11-05 NOTE — ED Triage Notes (Addendum)
Reports emesis and abominal pain that started today. headache yesterday. Arrives by EMS. Pt hyperventilating. RR even and unlabored. Pt stops hyperventilating when answering questions. Poor historian. Denies CP or SOB. Pt states she uses cigarettes daily and smoke mariajuana this AM. Inquired why patient so sleepy, states she is "drained from throwing up"

## 2018-12-08 ENCOUNTER — Ambulatory Visit: Payer: Medicaid Other | Admitting: Advanced Practice Midwife

## 2018-12-08 ENCOUNTER — Other Ambulatory Visit: Payer: Self-pay

## 2018-12-08 DIAGNOSIS — N760 Acute vaginitis: Secondary | ICD-10-CM | POA: Diagnosis not present

## 2018-12-08 DIAGNOSIS — Z113 Encounter for screening for infections with a predominantly sexual mode of transmission: Secondary | ICD-10-CM | POA: Diagnosis not present

## 2018-12-08 DIAGNOSIS — B9689 Other specified bacterial agents as the cause of diseases classified elsewhere: Secondary | ICD-10-CM

## 2018-12-08 LAB — WET PREP FOR TRICH, YEAST, CLUE
Trichomonas Exam: NEGATIVE
Yeast Exam: NEGATIVE

## 2018-12-08 MED ORDER — METRONIDAZOLE 0.75 % VA GEL: 1.0000 | Freq: Every day | VAGINAL | 0 refills | Status: AC

## 2018-12-08 NOTE — Addendum Note (Signed)
Addended by: Hal Morales A on: 12/08/2018 01:20 PM   Modules accepted: Orders
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
## 2018-12-08 NOTE — Progress Notes (Addendum)
Here today for STD screening. Accepts bloodwork. Hal Morales, RN Wet Prep reviewed. RN attempted to treat patient for BV per standing orders. Patient stated she cannot take "those pills they make me sick." RN informed provider of above. Verbal order given for Metrogel. Hal Morales, RN

## 2018-12-13 LAB — GONOCOCCUS CULTURE

## 2018-12-16 ENCOUNTER — Telehealth: Payer: Self-pay

## 2018-12-16 ENCOUNTER — Encounter: Payer: Self-pay | Admitting: Physician Assistant

## 2018-12-16 ENCOUNTER — Encounter: Payer: Self-pay | Admitting: Advanced Practice Midwife

## 2018-12-16 DIAGNOSIS — Z8619 Personal history of other infectious and parasitic diseases: Secondary | ICD-10-CM | POA: Insufficient documentation

## 2018-12-16 NOTE — Telephone Encounter (Signed)
TC to [place] with [facility]. Reports patient has hx of syphilis and was tx'd last year (10/2017) at [facility] Ratio 10/2017 was 1:4 per [place].  No further f/u needed at this time. Aileen Fass, RN

## 2018-12-16 NOTE — Progress Notes (Signed)
Patient with history of Syphilis and was treated in 2019.  Rec that patient RTC for an annual titer check in 1 year or sooner for evaluation if needed.

## 2018-12-17 NOTE — Telephone Encounter (Signed)
TC with patient.  Verified ID via password. Informed of RPR results and instructed to repeat in 1 year.  Patient verbalized understanding. Aileen Fass, RN

## 2019-04-26 ENCOUNTER — Ambulatory Visit
Admission: EM | Admit: 2019-04-26 | Discharge: 2019-04-26 | Disposition: A | Discharge status: Home / Self Care | Payer: Medicaid Other | Attending: Family Medicine | Admitting: Family Medicine

## 2019-04-26 ENCOUNTER — Other Ambulatory Visit: Payer: Self-pay

## 2019-04-26 DIAGNOSIS — N390 Urinary tract infection, site not specified: Secondary | ICD-10-CM | POA: Diagnosis present

## 2019-04-26 DIAGNOSIS — A5901 Trichomonal vulvovaginitis: Secondary | ICD-10-CM | POA: Insufficient documentation

## 2019-04-26 LAB — URINALYSIS, COMPLETE (UACMP) WITH MICROSCOPIC
Bilirubin Urine: NEGATIVE
Glucose, UA: NEGATIVE mg/dL
Hgb urine dipstick: NEGATIVE
Ketones, ur: NEGATIVE mg/dL
Nitrite: NEGATIVE
Protein, ur: NEGATIVE mg/dL
Specific Gravity, Urine: 1.015 (ref 1.005–1.030)
pH: 6 (ref 5.0–8.0)

## 2019-04-26 LAB — CHLAMYDIA/NGC RT PCR (ARMC ONLY)
Chlamydia Tr: NOT DETECTED
N gonorrhoeae: NOT DETECTED

## 2019-04-26 MED ORDER — CEPHALEXIN 500 MG PO CAPS: 500.0000 mg | ORAL_CAPSULE | Freq: Two times a day (BID) | ORAL | 0 refills | Status: DC

## 2019-04-26 MED ORDER — METRONIDAZOLE 500 MG PO TABS: 2000.0000 mg | ORAL_TABLET | Freq: Once | ORAL | 0 refills | Status: AC

## 2019-04-26 NOTE — ED Triage Notes (Signed)
Pt presents with c/o lower abdominal pain, urine odor, frequency, and reports pain during sex x 4-5 days.  Has vaginal discharge with odor.  Concerned with UTI but also requesting STD testing.
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
## 2019-05-22 ENCOUNTER — Other Ambulatory Visit: Payer: Self-pay

## 2019-05-22 ENCOUNTER — Ambulatory Visit
Admission: EM | Admit: 2019-05-22 | Discharge: 2019-05-22 | Disposition: A | Discharge status: Other Acute Inpatient Hospital | Payer: Medicaid Other | Attending: Emergency Medicine | Admitting: Emergency Medicine

## 2019-05-22 ENCOUNTER — Emergency Department: Payer: Medicaid Other

## 2019-05-22 ENCOUNTER — Emergency Department
Admission: EM | Admit: 2019-05-22 | Discharge: 2019-05-22 | Disposition: A | Discharge status: Home / Self Care | Payer: Medicaid Other | Attending: Emergency Medicine | Admitting: Emergency Medicine

## 2019-05-22 ENCOUNTER — Encounter: Payer: Self-pay | Admitting: Emergency Medicine

## 2019-05-22 DIAGNOSIS — R197 Diarrhea, unspecified: Secondary | ICD-10-CM | POA: Insufficient documentation

## 2019-05-22 DIAGNOSIS — Z79899 Other long term (current) drug therapy: Secondary | ICD-10-CM | POA: Diagnosis not present

## 2019-05-22 DIAGNOSIS — K8020 Calculus of gallbladder without cholecystitis without obstruction: Secondary | ICD-10-CM | POA: Insufficient documentation

## 2019-05-22 DIAGNOSIS — R1011 Right upper quadrant pain: Secondary | ICD-10-CM

## 2019-05-22 DIAGNOSIS — R1033 Periumbilical pain: Secondary | ICD-10-CM | POA: Diagnosis present

## 2019-05-22 DIAGNOSIS — F17210 Nicotine dependence, cigarettes, uncomplicated: Secondary | ICD-10-CM | POA: Insufficient documentation

## 2019-05-22 DIAGNOSIS — R112 Nausea with vomiting, unspecified: Secondary | ICD-10-CM | POA: Diagnosis present

## 2019-05-22 LAB — CBC WITH DIFFERENTIAL/PLATELET
Abs Immature Granulocytes: 0.03 10*3/uL (ref 0.00–0.07)
Basophils Absolute: 0 10*3/uL (ref 0.0–0.1)
Basophils Relative: 0 %
Eosinophils Absolute: 0 10*3/uL (ref 0.0–0.5)
Eosinophils Relative: 0 %
HCT: 41.8 % (ref 36.0–46.0)
Hemoglobin: 14.4 g/dL (ref 12.0–15.0)
Immature Granulocytes: 0 %
Lymphocytes Relative: 10 %
Lymphs Abs: 0.8 10*3/uL (ref 0.7–4.0)
MCH: 26.5 pg (ref 26.0–34.0)
MCHC: 34.4 g/dL (ref 30.0–36.0)
MCV: 77 fL — ABNORMAL LOW (ref 80.0–100.0)
Monocytes Absolute: 0.2 10*3/uL (ref 0.1–1.0)
Monocytes Relative: 3 %
Neutro Abs: 6.9 10*3/uL (ref 1.7–7.7)
Neutrophils Relative %: 87 %
Platelets: 290 10*3/uL (ref 150–400)
RBC: 5.43 MIL/uL — ABNORMAL HIGH (ref 3.87–5.11)
RDW: 13.7 % (ref 11.5–15.5)
WBC: 8 10*3/uL (ref 4.0–10.5)
nRBC: 0 % (ref 0.0–0.2)

## 2019-05-22 LAB — COMPREHENSIVE METABOLIC PANEL
ALT: 12 U/L (ref 0–44)
AST: 21 U/L (ref 15–41)
Albumin: 4.6 g/dL (ref 3.5–5.0)
Alkaline Phosphatase: 79 U/L (ref 38–126)
Anion gap: 14 (ref 5–15)
BUN: 9 mg/dL (ref 6–20)
CO2: 23 mmol/L (ref 22–32)
Calcium: 10.2 mg/dL (ref 8.9–10.3)
Chloride: 101 mmol/L (ref 98–111)
Creatinine, Ser: 0.87 mg/dL (ref 0.44–1.00)
GFR calc Af Amer: >60 mL/min (ref >60)
GFR calc non Af Amer: >60 mL/min (ref >60)
Glucose, Bld: 127 mg/dL — ABNORMAL HIGH (ref 70–99)
Potassium: 3.4 mmol/L — ABNORMAL LOW (ref 3.5–5.1)
Sodium: 138 mmol/L (ref 135–145)
Total Bilirubin: 1.1 mg/dL (ref 0.3–1.2)
Total Protein: 9.4 g/dL — ABNORMAL HIGH (ref 6.5–8.1)

## 2019-05-22 LAB — LIPASE, BLOOD: Lipase: 20 U/L (ref 11–51)

## 2019-05-22 LAB — URINALYSIS, COMPLETE (UACMP) WITH MICROSCOPIC
Bacteria, UA: NONE SEEN
Bilirubin Urine: NEGATIVE
Glucose, UA: NEGATIVE mg/dL
Hgb urine dipstick: NEGATIVE
Ketones, ur: 15 mg/dL — AB
Nitrite: NEGATIVE
Protein, ur: 30 mg/dL — AB
RBC / HPF: NONE SEEN RBC/hpf (ref 0–5)
Specific Gravity, Urine: 1.015 (ref 1.005–1.030)
pH: >9 — ABNORMAL HIGH (ref 5.0–8.0)

## 2019-05-22 LAB — PREGNANCY, URINE: Preg Test, Ur: NEGATIVE

## 2019-05-22 MED ORDER — ONDANSETRON HCL 4 MG/2ML IJ SOLN
4.0000 mg | Freq: Once | INTRAMUSCULAR | Status: AC
  Administered 2019-05-22: 23:00:00 4 mg via INTRAVENOUS
  Filled 2019-05-22: qty 2

## 2019-05-22 MED ORDER — SODIUM CHLORIDE 0.9 % IV BOLUS
1000.0000 mL | Freq: Once | INTRAVENOUS | Status: AC
  Administered 2019-05-22: 1000 mL via INTRAVENOUS

## 2019-05-22 MED ORDER — DICYCLOMINE HCL 10 MG PO CAPS: 10.0000 mg | ORAL_CAPSULE | Freq: Four times a day (QID) | ORAL | 0 refills | Status: DC | PRN

## 2019-05-22 MED ORDER — DROPERIDOL 2.5 MG/ML IJ SOLN
2.5000 mg | Freq: Once | INTRAMUSCULAR | Status: AC
  Administered 2019-05-22: 2.5 mg via INTRAVENOUS
  Filled 2019-05-22: qty 2

## 2019-05-22 MED ORDER — ONDANSETRON HCL 4 MG PO TABS: 4.0000 mg | ORAL_TABLET | Freq: Three times a day (TID) | ORAL | 0 refills | Status: DC | PRN

## 2019-05-22 MED ORDER — ONDANSETRON 8 MG PO TBDP
8.0000 mg | ORAL_TABLET | Freq: Once | ORAL | Status: AC
  Administered 2019-05-22: 8 mg via ORAL

## 2019-05-22 MED ORDER — KETOROLAC TROMETHAMINE 30 MG/ML IJ SOLN
30.0000 mg | Freq: Once | INTRAMUSCULAR | Status: AC
  Administered 2019-05-22: 20:00:00 30 mg via INTRAVENOUS
  Filled 2019-05-22: qty 1

## 2019-05-22 NOTE — ED Triage Notes (Signed)
Pt via EMS from [facility]. Pt states that she has been having RUQ pain since sometime this morning. Pt also c/o NV and states "Ive thrown up about 30 times." Pt was given 8mg  of Zofran at [facility]. Pt A&Ox4 and NAD at this time. Pt states, "I just want some pain medication."

## 2019-05-22 NOTE — ED Notes (Signed)
Pt given phone per request
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
## 2019-05-22 NOTE — ED Provider Notes (Addendum)
HPI  SUBJECTIVE:  Stacey Howe is a 39 y.o. female who presents with acute onset of nausea, multiple episodes of nonbilious nonbloody emesis since 0400 today.  States that she had [employer] last night, followed by abdominal pain and an episode of diarrhea.  She started vomiting hours later.  No further episodes of diarrhea.  States that she is unable to tolerate any p.o. whatsoever.  She reports chills.  She states that she has periumbilical pain that she cannot describe, but is 10/10, lasting hours that radiates through to her back.  She reports decreased urine output, has urinated twice today.  She reports feeling lightheaded, dizzy and reports palpitations.  No fevers, chest pain, shortness of breath, abdominal distention, urinary complaints.  No sick contacts.  States that she smokes marijuana and smoked some last night, but has not been smoking more than usual.  Denies alcohol use, change in medications.  Denies increased thirst, syncope.  She tried Pepto-Bismol, lying down, walking without improvement in her symptoms.  There are no aggravating or alleviating factors for her abdominal pain or vomiting.  She has a past medical history of rheumatoid arthritis, pyelonephritis.  She is status post bilateral tubal ligation.  No history of nephrolithiasis, diabetes, hypertension, pancreatitis, gallbladder disease, other abdominal surgeries.  Family history negative for gallbladder disease.  LMP: 3/19.  [facility]   Past Medical History:  Diagnosis Date  . Bipolar affective disorder (HCC) 2007  . Chronic pain syndrome   . 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 Pyelonephritis 09/08/14  . Rheumatoid arthritis (HCC) 2016  . Sickle cell trait [facility])     Past Surgical History:  Procedure Laterality Date  . CESAREAN SECTION  2000  . CESAREAN SECTION  2015  . TUBAL LIGATION      Family History  Problem Relation Age of Onset  . Asthma Mother   . HIV Mother    . Drug abuse Mother   . Arthritis Father   . Cancer Maternal Aunt     Social History   Tobacco Use  . Smoking status: Light Tobacco Smoker    Packs/day: 0.25    Years: 15.00    Pack years: 3.75    Types: Cigarettes  . Smokeless tobacco: Never Used  Substance Use Topics  . Alcohol use: Never  . Drug use: Not Currently    Frequency: 7.0 times per week    Types: Marijuana    No current facility-administered medications for this encounter.  Current Outpatient Medications:  .  buprenorphine-naloxone (SUBOXONE) 8-2 mg SUBL SL tablet, Place 1 tablet under the tongue 2 (two) times daily., Disp: , Rfl:  .  methotrexate (RHEUMATREX) 2.5 MG tablet, Take 10 mg by mouth once a week. Caution:Chemotherapy. Protect from light., Disp: , Rfl:  .  QUEtiapine (SEROQUEL) 200 MG tablet, Take 200 mg by mouth at bedtime., Disp: , Rfl:  .  sertraline (ZOLOFT) 100 MG tablet, Take 100 mg by mouth daily., Disp: , Rfl:   Allergies  Allergen Reactions  . Vicodin [Hydrocodone-Acetaminophen] Anaphylaxis     ROS  As noted in HPI.   Physical Exam  BP (!) 147/109 (BP Location: Left Arm)   Pulse 84   Temp 98 F (36.7 C) (Oral)   Resp 16   Ht 5\' 5"  (1.651 m)   Wt 90.7 kg   LMP 05/08/2019 (Approximate)   SpO2 100%   BMI 33.28 kg/m   Constitutional: Well developed, well nourished,  moving around unable to find comfortable position. Actively vomiting. Eyes:  EOMI, conjunctiva normal bilaterally HENT: Normocephalic, atraumatic,mucus membranes moist Respiratory: Normal inspiratory effort Cardiovascular: Normal rate regular rhythm no murmurs rubs or gallops cap refill 2 to 3 seconds. GI: Normal appearance, soft.  Active bowel sounds.  No distention.  Positive periumbilical/epigastric tenderness.  No guarding, rebound.  Negative tap table test.  Negative Murphy, negative McBurney. Back: No CVAT.  No midline lumbar tenderness although patient indicates mid lower back as area of pain. skin: No rash,  skin intact Musculoskeletal: no deformities Neurologic: Alert & oriented x 3, no focal neuro deficits Psychiatric: Speech and behavior appropriate   ED Course   Medications  ondansetron (ZOFRAN-ODT) disintegrating tablet 8 mg (8 mg Oral Given 05/22/19 1607)    Orders Placed This Encounter  Procedures  . Urinalysis, Complete w Microscopic    Standing Status:   Standing    Number of Occurrences:   1  . Pregnancy, urine    Standing Status:   Standing    Number of Occurrences:   1    Results for orders placed or performed during the hospital encounter of 05/22/19 (from the past 24 hour(s))  Urinalysis, Complete w Microscopic     Status: Abnormal   Collection Time: 05/22/19  4:09 PM  Result Value Ref Range   Color, Urine YELLOW YELLOW   APPearance CLOUDY (A) CLEAR   Specific Gravity, Urine 1.015 1.005 - 1.030   pH >9.0 (H) 5.0 - 8.0   Glucose, UA NEGATIVE NEGATIVE mg/dL   Hgb urine dipstick NEGATIVE NEGATIVE   Bilirubin Urine NEGATIVE NEGATIVE   Ketones, ur 15 (A) NEGATIVE mg/dL   Protein, ur 30 (A) NEGATIVE mg/dL   Nitrite NEGATIVE NEGATIVE   Leukocytes,Ua TRACE (A) NEGATIVE   Squamous Epithelial / LPF 0-5 0 - 5   WBC, UA 6-10 0 - 5 WBC/hpf   RBC / HPF NONE SEEN 0 - 5 RBC/hpf   Bacteria, UA NONE SEEN NONE SEEN  Pregnancy, urine     Status: None   Collection Time: 05/22/19  4:09 PM  Result Value Ref Range   Preg Test, Ur NEGATIVE NEGATIVE   No results found.  ED Clinical Impression  1. Intractable vomiting with nausea, unspecified vomiting type   2. Periumbilical abdominal pain      ED Assessment/Plan  Concern for pancreatitis with periumbilical tenderness/abdominal pain that radiates through to her back.  She appears very uncomfortable here.  Give her 8 mg of Zofran without improvement in her symptoms.  She appears dry, cap refill is more than 2 seconds.  In the differential is pancreatitis, marijuana hyperemesis syndrome, gastroenteritis.  Transferring her to the  ED for further evaluation, pain and nausea control, fluids, possible imaging.  Patient requesting go by EMS.  Patient is not pregnant.  She has proteinuria, ketones and trace leukocytes but negative esterase no bacteria in her urine.  Do not think this is a urinary tract infection.   Discussed MDM, treatment plan, and rationale for transfer to the emergency department with patient.  Patient agrees with plan.  Spoke with Velna Hatchet, Camera operator at [facility].  Meds ordered this encounter  Medications  . ondansetron (ZOFRAN-ODT) disintegrating tablet 8 mg    *This clinic note was created using [employer]. Therefore, there may be occasional mistakes despite careful proofreading.   ?    Melynda Ripple, MD 05/22/19 1625    Melynda Ripple, MD 05/22/19 [phone]

## 2019-05-22 NOTE — ED Notes (Signed)
Pt transported to [facility]

## 2019-05-22 NOTE — ED Triage Notes (Signed)
Patient c/o vomiting that started 4 am this morning.  Patient reports lower back pain started about a hour ago.  Patient reports chills.  Patient c/o mid upper abdominal pain that started this morning.

## 2019-05-22 NOTE — Discharge Instructions (Signed)
Please seek medical attention for any high fevers, chest pain, shortness of breath, change in behavior, persistent vomiting, bloody stool or any other new or concerning symptoms.  

## 2019-05-26 ENCOUNTER — Other Ambulatory Visit: Payer: Self-pay | Admitting: Surgery

## 2019-05-26 DIAGNOSIS — K8050 Calculus of bile duct without cholangitis or cholecystitis without obstruction: Secondary | ICD-10-CM

## 2019-06-10 ENCOUNTER — Encounter
Admission: RE | Admit: 2019-06-10 | Discharge: 2019-06-10 | Disposition: A | Discharge status: Home / Self Care | Payer: Medicaid Other | Source: Ambulatory Visit | Attending: Surgery | Admitting: Surgery

## 2019-06-10 ENCOUNTER — Other Ambulatory Visit: Payer: Self-pay

## 2019-06-10 DIAGNOSIS — K8050 Calculus of bile duct without cholangitis or cholecystitis without obstruction: Secondary | ICD-10-CM | POA: Diagnosis present

## 2019-06-10 MED ORDER — TECHNETIUM TC 99M MEBROFENIN IV KIT
5.1600 | PACK | Freq: Once | INTRAVENOUS | Status: AC | PRN
  Administered 2019-06-10: 10:00:00 5.16 via INTRAVENOUS

## 2019-06-15 ENCOUNTER — Ambulatory Visit
Admission: EM | Admit: 2019-06-15 | Discharge: 2019-06-15 | Disposition: A | Discharge status: Home / Self Care | Payer: Medicaid Other | Attending: Urgent Care | Admitting: Urgent Care

## 2019-06-15 ENCOUNTER — Ambulatory Visit: Payer: Medicaid Other | Admitting: Physician Assistant

## 2019-06-15 ENCOUNTER — Other Ambulatory Visit: Payer: Self-pay

## 2019-06-15 ENCOUNTER — Encounter: Payer: Self-pay | Admitting: Emergency Medicine

## 2019-06-15 DIAGNOSIS — N12 Tubulo-interstitial nephritis, not specified as acute or chronic: Secondary | ICD-10-CM | POA: Diagnosis present

## 2019-06-15 DIAGNOSIS — R319 Hematuria, unspecified: Secondary | ICD-10-CM | POA: Insufficient documentation

## 2019-06-15 DIAGNOSIS — Z113 Encounter for screening for infections with a predominantly sexual mode of transmission: Secondary | ICD-10-CM

## 2019-06-15 DIAGNOSIS — N390 Urinary tract infection, site not specified: Secondary | ICD-10-CM | POA: Insufficient documentation

## 2019-06-15 DIAGNOSIS — N760 Acute vaginitis: Secondary | ICD-10-CM

## 2019-06-15 DIAGNOSIS — B9689 Other specified bacterial agents as the cause of diseases classified elsewhere: Secondary | ICD-10-CM

## 2019-06-15 LAB — URINALYSIS, COMPLETE (UACMP) WITH MICROSCOPIC
Bilirubin Urine: NEGATIVE
Glucose, UA: NEGATIVE mg/dL
Ketones, ur: NEGATIVE mg/dL
Nitrite: POSITIVE — AB
Protein, ur: 100 mg/dL — AB
Specific Gravity, Urine: 1.015 (ref 1.005–1.030)
WBC, UA: >50 WBC/hpf (ref 0–5)
pH: 6.5 (ref 5.0–8.0)

## 2019-06-15 LAB — WET PREP FOR TRICH, YEAST, CLUE
Trichomonas Exam: NEGATIVE
Yeast Exam: NEGATIVE

## 2019-06-15 MED ORDER — CEFTRIAXONE SODIUM 1 G IJ SOLR
1.0000 g | Freq: Once | INTRAMUSCULAR | Status: AC
  Administered 2019-06-15: 1 g via INTRAMUSCULAR

## 2019-06-15 MED ORDER — METRONIDAZOLE 0.75 % VA GEL: 1.0000 | Freq: Every day | VAGINAL | 0 refills | Status: AC

## 2019-06-15 MED ORDER — CIPROFLOXACIN HCL 500 MG PO TABS: 500.0000 mg | ORAL_TABLET | Freq: Two times a day (BID) | ORAL | 0 refills | Status: AC

## 2019-06-15 NOTE — ED Triage Notes (Addendum)
Patient c/o urinary frequency and urgency that started 3 days ago. She is also c/o back pain that started today. She states she was seen at the health department today and is being treated for BV.

## 2019-06-15 NOTE — Discharge Instructions (Signed)
Take medication as prescribed. Rest. Drink plenty of fluids. Low fat diet for your gallbladder.   Follow up with your primary care physician this week.  Return to Urgent care or ER for fever, inability to tolerate food or fluids, increased pain, for new or worsening concerns.
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
## 2019-06-16 ENCOUNTER — Encounter: Payer: Self-pay | Admitting: Physician Assistant
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
## 2019-06-17 LAB — URINE CULTURE: Culture: >=100000 — AB

## 2019-06-19 LAB — GONOCOCCUS CULTURE

## 2019-06-19 NOTE — Progress Notes (Signed)
Chart reviewed by Pharmacist  Suzanne Walker PharmD, Contract Pharmacist at [facility]  

## 2019-06-21 ENCOUNTER — Encounter
Admission: RE | Admit: 2019-06-21 | Discharge: 2019-06-21 | Disposition: A | Discharge status: Home / Self Care | Payer: Medicaid Other | Source: Ambulatory Visit | Attending: Surgery | Admitting: Surgery

## 2019-06-21 ENCOUNTER — Other Ambulatory Visit: Payer: Self-pay

## 2019-06-21 ENCOUNTER — Ambulatory Visit: Payer: Self-pay | Admitting: Surgery

## 2019-06-21 DIAGNOSIS — Z01818 Encounter for other preprocedural examination: Secondary | ICD-10-CM | POA: Diagnosis present

## 2019-06-21 HISTORY — DX: Chronic kidney disease, unspecified: N18.9

## 2019-06-21 HISTORY — DX: Syphilis, unspecified: A53.9

## 2019-06-21 NOTE — H&P (Signed)
Subjective:   CC: Biliary colic [K80.50]  HPI:  Stacey Howe is a 39 y.o. female who was referred by [facility] * for evaluation of above CC. Symptoms were first noted a few days ago. Pain is dull and intermittent, confined to the epigastric area, without radiation.  Associated with nausea vomiting, exacerbated by nothing specific.  Has some right flank pain as well, mostly concerned about nausea/emesis.      Past Medical History:  has a past medical history of Bipolar disorder (CMS-HCC), Depression, History of chlamydia, History of trichomoniasis, RA (rheumatoid arthritis) (CMS-HCC), Schizophrenia (CMS-HCC), and UTI (urinary tract infection).  Past Surgical History:  has a past surgical history that includes Cesarean section; dilation & currettage (N/A, 05/05/2012); cesarean delivery (N/A, 10/19/2013); and open reduction mandibular condyle fracture (Bilateral, 08/28/2017).  Family History: family history includes Breast cancer in her maternal aunt; No Known Problems in her father and mother.  Social History:  reports that she has been smoking cigarettes. She has a 5.00 pack-year smoking history. She has never used smokeless tobacco. She reports that she does not drink alcohol or use drugs.  Current Medications: has a current medication list which includes the following prescription(s): buprenorphine-naloxone, ibuprofen, methotrexate, pantoprazole, promethazine, and venlafaxine.  Allergies:       Allergies as of 05/25/2019 - Reviewed 05/25/2019  Allergen Reaction Noted  . Hydrocodone-acetaminophen Hives, Anaphylaxis, Rash, and Swelling 11/17/2014    ROS:  A 15 point review of systems was performed and pertinent positives and negatives noted in HPI    Objective:   BP 110/82   Pulse 72   Ht 165.1 cm (5' 5")   BMI 28.43 kg/m    Constitutional :  alert, appears stated age, cooperative and no distress  Lymphatics/Throat:  no asymmetry, masses, or  scars  Respiratory:  clear to auscultation bilaterally  Cardiovascular:  regular rate and rhythm  Gastrointestinal: soft, non-tender; bowel sounds normal; no masses,  no organomegaly.    Musculoskeletal: Steady gait and movement  Skin: Cool and moist  Psychiatric: Normal affect, non-agitated, not confused       LABS:  -      Lab Results  Component Value Date   WBC 7.7 08/25/2017   HGB 12.6 08/25/2017   HCT 38.9 08/25/2017   PLT 263 08/25/2017   -      Lab Results  Component Value Date   NA 138 08/25/2017   K 3.3 (L) 08/25/2017   CL 102 08/25/2017   CO2 24 08/25/2017   BUN 9 08/25/2017   CREATININE 0.9 08/25/2017   CALCIUM 9.4 08/25/2017   TP 7.8 02/01/2010   ALB 4.2 08/25/2017   TBILI 0.4 08/25/2017   ALKPHOS 71 08/25/2017   AST 17 08/25/2017   ALT 13 (L) 08/25/2017   GLUCOSE 107 08/25/2017   GFR 97 08/25/2017     RADS: CLINICAL DATA: Right upper quadrant pain  EXAM: ULTRASOUND ABDOMEN LIMITED RIGHT UPPER QUADRANT  COMPARISON: CT from 09/27/15  FINDINGS: Gallbladder:  Gallbladder is well distended with multiple gallstones within. No wall thickening or pericholecystic fluid is noted. Negative sonographic Murphy's sign.  Common bile duct:  Diameter: 3.4 mm.  Liver:  No focal lesion identified. Within normal limits in parenchymal echogenicity. Portal vein is patent on color Doppler imaging with normal direction of blood flow towards the liver.  Other: None.  IMPRESSION: Cholelithiasis without complicating factors.   Electronically Signed  By: Mark Lukens M.D.  On: 05/22/2019 21:03 Assessment:        Biliary colic [K80.50]  Plan:   1. Biliary colic [K80.50] Called patient back with abnormal EF on HIDA.  No guarantee symptoms will resolve but willing to proceed with surgery.   iscussed the risk of surgery including post-op infxn, seroma, biloma, chronic pain, poor-delayed wound healing, retained gallstone,  conversion to open procedure, post-op SBO or ileus, and need for additional procedures to address said risks.  The risks of general anesthetic including MI, CVA, sudden death or even reaction to anesthetic medications also discussed. Alternatives include continued observation.  Benefits include possible symptom relief, prevention of complications including acute cholecystitis, pancreatitis.  Typical post operative recovery of 3-5 days rest, continued pain in area and incision sites, possible loose stools up to 4-6 weeks, also discussed.  ED return precautions given for sudden increase in RUQ pain, with possible accompanying fever, nausea, and/or vomiting.  The patient understands the risks, any and all questions were answered to the patient's satisfaction.   K82.8 biliary dyskinesia [id number] robotic assisted lap chole

## 2019-06-21 NOTE — Patient Instructions (Addendum)
INSTRUCTIONS FOR SURGERY     Your surgery is scheduled for:   Friday, MAY 7th     To find out your arrival time for the day of surgery,          please call [phone] between 1 pm and 3 pm on :  Thursday, MAY 6th     When you arrive for surgery, report to the SECOND FLOOR OF THE MEDICAL MALL.       Do NOT stop on the first floor to register.    REMEMBER: Instructions that are not followed completely may result in serious medical risk,  up to and including death, or upon the discretion of your surgeon and anesthesiologist,            your surgery may need to be rescheduled.  __X__ 1. Do not eat food after midnight the night before your procedure.                    No gum, candy, lozenger, tic tacs, tums or hard candies.                  ABSOLUTELY NOTHING SOLID IN YOUR MOUTH AFTER MIDNIGHT                    You may drink unlimited clear liquids up to 2 hours before you are scheduled to arrive for surgery.                   Do not drink anything within those 2 hours unless you need to take medicine, then take the                   smallest amount you need.  Clear liquids include:  water, apple juice without pulp,                   any flavor Gatorade, Black coffee, black tea.  Sugar may be added but no dairy/ honey /lemon.                        Broth and jello is not considered a clear liquid.  __x__  2. On the morning of surgery, please brush your teeth with toothpaste and water. You may rinse with                  mouthwash if you wish but DO NOT SWALLOW TOOTHPASTE OR MOUTHWASH  __X___3. NO alcohol for 24 hours before or after surgery.  __x___ 4.  Do NOT smoke or use e-cigarettes for 24 HOURS PRIOR TO SURGERY.                      DO NOT Use any chewable tobacco products for at least 6 hours prior to surgery.  __x___ 5. If you start any new medication after this appointment and prior to surgery, please  Bring it with you on the day of surgery.  ___x__ 6. Notify your doctor if there is any change in your medical condition, such as fever,  infection, vomitting, diarrhea or any open sores.  __x___ 7.  USE the CHG SOAP as instructed, the night before surgery and the day of surgery.                   Once you have washed with this soap, do NOT use any of the following: Powders, perfumes                    or lotions. Please do not wear make up, hairpins, clips or nail polish. You MAY wear deodorant.                   Men may shave their face and neck.  Women need to shave 48 hours prior to surgery.                   DO NOT wear ANY jewelry on the day of surgery. If there are rings that are too tight to                    remove easily, please address this prior to the surgery day. Piercings need to be removed.                                                                     NO METAL ON YOUR BODY.                    Do NOT bring any valuables.  If you came to Pre-Admit testing then you will not need license,                     insurance card or credit card.  If you will be staying overnight, please either leave your things in                     the car or have your family be responsible for these items.                     [facility] IS NOT RESPONSIBLE FOR BELONGINGS OR VALUABLES.  ___X__ 8. DO NOT wear contact lenses on surgery day.  You may not have dentures,                     Hearing aides, contacts or glasses in the operating room. These items can be                    Placed in the Recovery Room to receive immediately after surgery.  __x___ 9. IF YOU ARE SCHEDULED TO GO HOME ON THE SAME DAY, YOU MUST                   Have someone to drive you home and to stay with you  for the first 24 hours.                    Have an arrangement prior to arriving on surgery day.  ___x__ 10. Take the following medications on the morning of surgery with a sip of water:  1. SUBOXONE                     2.                      3.                      _____ 11.  Follow any instructions provided to you by your surgeon.                        Such as enema, clear liquid bowel prep  __X__  12. STOP ALL ASPIRIN PRODUCTS AS OF TODAY, MAY 3rd                       THIS INCLUDES BC POWDERS / GOODIES POWDER  __x___ 13. STOP Anti-inflammatories as of: TODAY, MAY 3rd                      This includes IBUPROFEN / MOTRIN / ADVIL / ALEVE/ NAPROXYN                    YOU MAY TAKE TYLENOL ANY TIME PRIOR TO SURGERY.  __x____18.   Wear clean and comfortable clothing to the hospital, something loose.  BRING PHONE NUMBERS FOR CONTACT PEOPLE.  PLEASE HAVE STOOL SOFTENERS AVAILABLE FOR USE AT HOME, AFTER   SURGERY.  START TAKING THEM AS SOON AS YOU GET HOME.  YOU DO NOT    WANT TO BE STRAINING WITH MOVING YOUR BOWELS.  HAVE A FIRM PILLOW AVAILABLE THAT YOU CAN USE TO SUPPORT YOUR   BELLY AFTER SURGERY WHEN YOU ARE COUGHING, LAUGHING, ETC.

## 2019-06-21 NOTE — H&P (View-Only) (Signed)
Subjective:   CC: Biliary colic [W09.81]  HPI:  Stacey Howe is a 39 y.o. female who was referred by [facility] * for evaluation of above CC. Symptoms were first noted a few days ago. Pain is dull and intermittent, confined to the epigastric area, without radiation.  Associated with nausea vomiting, exacerbated by nothing specific.  Has some right flank pain as well, mostly concerned about nausea/emesis.      Past Medical History:  has a past medical history of Bipolar disorder (CMS-HCC), Depression, History of chlamydia, History of trichomoniasis, RA (rheumatoid arthritis) (CMS-HCC), Schizophrenia (CMS-HCC), and UTI (urinary tract infection).  Past Surgical History:  has a past surgical history that includes Cesarean section; dilation & currettage (N/A, 05/05/2012); cesarean delivery (N/A, 10/19/2013); and open reduction mandibular condyle fracture (Bilateral, 08/28/2017).  Family History: family history includes Breast cancer in her maternal aunt; No Known Problems in her father and mother.  Social History:  reports that she has been smoking cigarettes. She has a 5.00 pack-year smoking history. She has never used smokeless tobacco. She reports that she does not drink alcohol or use drugs.  Current Medications: has a current medication list which includes the following prescription(s): buprenorphine-naloxone, ibuprofen, methotrexate, pantoprazole, promethazine, and venlafaxine.  Allergies:       Allergies as of 05/25/2019 - Reviewed 05/25/2019  Allergen Reaction Noted  . Hydrocodone-acetaminophen Hives, Anaphylaxis, Rash, and Swelling 11/17/2014    ROS:  A 15 point review of systems was performed and pertinent positives and negatives noted in HPI    Objective:   BP 110/82   Pulse 72   Ht 165.1 cm (5\' 5" )   BMI 28.43 kg/m    Constitutional :  alert, appears stated age, cooperative and no distress  Lymphatics/Throat:  no asymmetry, masses, or  scars  Respiratory:  clear to auscultation bilaterally  Cardiovascular:  regular rate and rhythm  Gastrointestinal: soft, non-tender; bowel sounds normal; no masses,  no organomegaly.    Musculoskeletal: Steady gait and movement  Skin: Cool and moist  Psychiatric: Normal affect, non-agitated, not confused       LABS:  -      Lab Results  Component Value Date   WBC 7.7 08/25/2017   HGB 12.6 08/25/2017   HCT 38.9 08/25/2017   PLT 263 08/25/2017   -      Lab Results  Component Value Date   NA 138 08/25/2017   K 3.3 (L) 08/25/2017   CL 102 08/25/2017   CO2 24 08/25/2017   BUN 9 08/25/2017   CREATININE 0.9 08/25/2017   CALCIUM 9.4 08/25/2017   TP 7.8 02/01/2010   ALB 4.2 08/25/2017   TBILI 0.4 08/25/2017   ALKPHOS 71 08/25/2017   AST 17 08/25/2017   ALT 13 (L) 08/25/2017   GLUCOSE 107 08/25/2017   GFR 97 08/25/2017     RADS: CLINICAL DATA: Right upper quadrant pain  EXAM: ULTRASOUND ABDOMEN LIMITED RIGHT UPPER QUADRANT  COMPARISON: CT from 09/27/15  FINDINGS: Gallbladder:  Gallbladder is well distended with multiple gallstones within. No wall thickening or pericholecystic fluid is noted. Negative sonographic Murphy's sign.  Common bile duct:  Diameter: 3.4 mm.  Liver:  No focal lesion identified. Within normal limits in parenchymal echogenicity. Portal vein is patent on color Doppler imaging with normal direction of blood flow towards the liver.  Other: None.  IMPRESSION: Cholelithiasis without complicating factors.   Electronically Signed  By: Inez Catalina M.D.  On: 05/22/2019 21:03 Assessment:  Biliary colic [K80.50]  Plan:   1. Biliary colic [K80.50] Called patient back with abnormal EF on HIDA.  No guarantee symptoms will resolve but willing to proceed with surgery.   iscussed the risk of surgery including post-op infxn, seroma, biloma, chronic pain, poor-delayed wound healing, retained gallstone,  conversion to open procedure, post-op SBO or ileus, and need for additional procedures to address said risks.  The risks of general anesthetic including MI, CVA, sudden death or even reaction to anesthetic medications also discussed. Alternatives include continued observation.  Benefits include possible symptom relief, prevention of complications including acute cholecystitis, pancreatitis.  Typical post operative recovery of 3-5 days rest, continued pain in area and incision sites, possible loose stools up to 4-6 weeks, also discussed.  ED return precautions given for sudden increase in RUQ pain, with possible accompanying fever, nausea, and/or vomiting.  The patient understands the risks, any and all questions were answered to the patient's satisfaction.   K82.8 biliary dyskinesia [id number] robotic assisted lap chole

## 2019-06-23 ENCOUNTER — Other Ambulatory Visit: Payer: Self-pay

## 2019-06-23 ENCOUNTER — Encounter: Payer: Self-pay | Admitting: Advanced Practice Midwife

## 2019-06-23 ENCOUNTER — Other Ambulatory Visit
Admission: RE | Admit: 2019-06-23 | Discharge: 2019-06-23 | Disposition: A | Discharge status: Home / Self Care | Payer: Medicaid Other | Source: Ambulatory Visit | Attending: Surgery | Admitting: Surgery

## 2019-06-23 DIAGNOSIS — Z01812 Encounter for preprocedural laboratory examination: Secondary | ICD-10-CM | POA: Insufficient documentation

## 2019-06-23 DIAGNOSIS — Z20822 Contact with and (suspected) exposure to covid-19: Secondary | ICD-10-CM | POA: Diagnosis not present

## 2019-06-23 LAB — BASIC METABOLIC PANEL
Anion gap: 7 (ref 5–15)
BUN: 6 mg/dL (ref 6–20)
CO2: 26 mmol/L (ref 22–32)
Calcium: 9.1 mg/dL (ref 8.9–10.3)
Chloride: 106 mmol/L (ref 98–111)
Creatinine, Ser: 0.86 mg/dL (ref 0.44–1.00)
GFR calc Af Amer: >60 mL/min (ref >60)
GFR calc non Af Amer: >60 mL/min (ref >60)
Glucose, Bld: 78 mg/dL (ref 70–99)
Potassium: 3.4 mmol/L — ABNORMAL LOW (ref 3.5–5.1)
Sodium: 139 mmol/L (ref 135–145)

## 2019-06-23 LAB — CBC
HCT: 37.4 % (ref 36.0–46.0)
Hemoglobin: 12.5 g/dL (ref 12.0–15.0)
MCH: 26.4 pg (ref 26.0–34.0)
MCHC: 33.4 g/dL (ref 30.0–36.0)
MCV: 78.9 fL — ABNORMAL LOW (ref 80.0–100.0)
Platelets: 311 10*3/uL (ref 150–400)
RBC: 4.74 MIL/uL (ref 3.87–5.11)
RDW: 13.9 % (ref 11.5–15.5)
WBC: 7.2 10*3/uL (ref 4.0–10.5)
nRBC: 0 % (ref 0.0–0.2)

## 2019-06-23 LAB — SARS CORONAVIRUS 2 (TAT 6-24 HRS): SARS Coronavirus 2: NEGATIVE

## 2019-06-24 ENCOUNTER — Encounter: Payer: Self-pay | Admitting: Advanced Practice Midwife

## 2019-06-24 MED ORDER — CEFAZOLIN SODIUM-DEXTROSE 2-4 GM/100ML-% IV SOLN
2.0000 g | INTRAVENOUS | Status: AC
  Administered 2019-06-25: 13:00:00 2 g via INTRAVENOUS

## 2019-06-24 MED ORDER — INDOCYANINE GREEN 25 MG IV SOLR
1.2500 mg | Freq: Once | INTRAVENOUS | Status: DC
  Filled 2019-06-24: qty 10

## 2019-06-25 ENCOUNTER — Ambulatory Visit: Payer: Medicaid Other | Admitting: Certified Registered Nurse Anesthetist

## 2019-06-25 ENCOUNTER — Encounter: Payer: Self-pay | Admitting: Surgery

## 2019-06-25 ENCOUNTER — Observation Stay
Admission: RE | Admit: 2019-06-25 | Discharge: 2019-06-27 | Disposition: A | Discharge status: Home / Self Care | Payer: Medicaid Other | Attending: Surgery | Admitting: Surgery

## 2019-06-25 ENCOUNTER — Other Ambulatory Visit: Payer: Self-pay

## 2019-06-25 ENCOUNTER — Encounter
Admission: RE | Disposition: A | Discharge status: Home / Self Care | Payer: Self-pay | Source: Home / Self Care | Attending: Surgery

## 2019-06-25 DIAGNOSIS — N189 Chronic kidney disease, unspecified: Secondary | ICD-10-CM | POA: Diagnosis not present

## 2019-06-25 DIAGNOSIS — K828 Other specified diseases of gallbladder: Secondary | ICD-10-CM | POA: Diagnosis not present

## 2019-06-25 DIAGNOSIS — Z8261 Family history of arthritis: Secondary | ICD-10-CM | POA: Diagnosis not present

## 2019-06-25 DIAGNOSIS — Z79899 Other long term (current) drug therapy: Secondary | ICD-10-CM | POA: Insufficient documentation

## 2019-06-25 DIAGNOSIS — D573 Sickle-cell trait: Secondary | ICD-10-CM | POA: Insufficient documentation

## 2019-06-25 DIAGNOSIS — Z791 Long term (current) use of non-steroidal anti-inflammatories (NSAID): Secondary | ICD-10-CM | POA: Insufficient documentation

## 2019-06-25 DIAGNOSIS — E1122 Type 2 diabetes mellitus with diabetic chronic kidney disease: Secondary | ICD-10-CM | POA: Insufficient documentation

## 2019-06-25 DIAGNOSIS — G894 Chronic pain syndrome: Secondary | ICD-10-CM | POA: Diagnosis not present

## 2019-06-25 DIAGNOSIS — Z79891 Long term (current) use of opiate analgesic: Secondary | ICD-10-CM | POA: Insufficient documentation

## 2019-06-25 DIAGNOSIS — F209 Schizophrenia, unspecified: Secondary | ICD-10-CM | POA: Insufficient documentation

## 2019-06-25 DIAGNOSIS — K8010 Calculus of gallbladder with chronic cholecystitis without obstruction: Secondary | ICD-10-CM | POA: Diagnosis not present

## 2019-06-25 DIAGNOSIS — Z885 Allergy status to narcotic agent status: Secondary | ICD-10-CM | POA: Diagnosis not present

## 2019-06-25 DIAGNOSIS — M069 Rheumatoid arthritis, unspecified: Secondary | ICD-10-CM | POA: Diagnosis not present

## 2019-06-25 DIAGNOSIS — F319 Bipolar disorder, unspecified: Secondary | ICD-10-CM | POA: Insufficient documentation

## 2019-06-25 DIAGNOSIS — F1111 Opioid abuse, in remission: Secondary | ICD-10-CM | POA: Diagnosis not present

## 2019-06-25 DIAGNOSIS — Z808 Family history of malignant neoplasm of other organs or systems: Secondary | ICD-10-CM | POA: Insufficient documentation

## 2019-06-25 DIAGNOSIS — Z803 Family history of malignant neoplasm of breast: Secondary | ICD-10-CM | POA: Diagnosis not present

## 2019-06-25 DIAGNOSIS — K810 Acute cholecystitis: Secondary | ICD-10-CM | POA: Diagnosis present

## 2019-06-25 DIAGNOSIS — F1290 Cannabis use, unspecified, uncomplicated: Secondary | ICD-10-CM | POA: Diagnosis not present

## 2019-06-25 DIAGNOSIS — Z809 Family history of malignant neoplasm, unspecified: Secondary | ICD-10-CM | POA: Insufficient documentation

## 2019-06-25 DIAGNOSIS — E559 Vitamin D deficiency, unspecified: Secondary | ICD-10-CM | POA: Insufficient documentation

## 2019-06-25 DIAGNOSIS — Z825 Family history of asthma and other chronic lower respiratory diseases: Secondary | ICD-10-CM | POA: Insufficient documentation

## 2019-06-25 DIAGNOSIS — F17210 Nicotine dependence, cigarettes, uncomplicated: Secondary | ICD-10-CM | POA: Insufficient documentation

## 2019-06-25 DIAGNOSIS — F419 Anxiety disorder, unspecified: Secondary | ICD-10-CM | POA: Insufficient documentation

## 2019-06-25 LAB — CBC
HCT: 40.6 % (ref 36.0–46.0)
Hemoglobin: 13.3 g/dL (ref 12.0–15.0)
MCH: 26.2 pg (ref 26.0–34.0)
MCHC: 32.8 g/dL (ref 30.0–36.0)
MCV: 79.9 fL — ABNORMAL LOW (ref 80.0–100.0)
Platelets: 305 10*3/uL (ref 150–400)
RBC: 5.08 MIL/uL (ref 3.87–5.11)
RDW: 13.7 % (ref 11.5–15.5)
WBC: 13.4 10*3/uL — ABNORMAL HIGH (ref 4.0–10.5)
nRBC: 0 % (ref 0.0–0.2)

## 2019-06-25 LAB — CREATININE, SERUM
Creatinine, Ser: 1.02 mg/dL — ABNORMAL HIGH (ref 0.44–1.00)
GFR calc Af Amer: >60 mL/min (ref >60)
GFR calc non Af Amer: >60 mL/min (ref >60)

## 2019-06-25 LAB — URINE DRUG SCREEN, QUALITATIVE (ARMC ONLY)
Amphetamines, Ur Screen: NOT DETECTED
Barbiturates, Ur Screen: NOT DETECTED
Benzodiazepine, Ur Scrn: NOT DETECTED
Cannabinoid 50 Ng, Ur ~~LOC~~: POSITIVE — AB
Cocaine Metabolite,Ur ~~LOC~~: NOT DETECTED
MDMA (Ecstasy)Ur Screen: NOT DETECTED
Methadone Scn, Ur: NOT DETECTED
Opiate, Ur Screen: NOT DETECTED
Phencyclidine (PCP) Ur S: NOT DETECTED
Tricyclic, Ur Screen: NOT DETECTED

## 2019-06-25 SURGERY — CHOLECYSTECTOMY, ROBOT-ASSISTED, LAPAROSCOPIC
Anesthesia: General | Site: Abdomen

## 2019-06-25 MED ORDER — LIDOCAINE-EPINEPHRINE (PF) 1 %-1:200000 IJ SOLN
INTRAMUSCULAR | Status: DC | PRN
  Administered 2019-06-25: 8.5 mL

## 2019-06-25 MED ORDER — OXYCODONE HCL 5 MG PO TABS
5.0000 mg | ORAL_TABLET | Freq: Four times a day (QID) | ORAL | Status: DC | PRN
  Administered 2019-06-26 (×2): 5 mg via ORAL
  Filled 2019-06-25 (×2): qty 1

## 2019-06-25 MED ORDER — GABAPENTIN 300 MG PO CAPS
300.0000 mg | ORAL_CAPSULE | Freq: Two times a day (BID) | ORAL | Status: DC
  Administered 2019-06-26 – 2019-06-27 (×3): 300 mg via ORAL
  Filled 2019-06-25 (×3): qty 1

## 2019-06-25 MED ORDER — LACTATED RINGERS IV SOLN: INTRAVENOUS | Status: DC

## 2019-06-25 MED ORDER — FENTANYL CITRATE (PF) 100 MCG/2ML IJ SOLN
INTRAMUSCULAR | Status: DC | PRN
  Administered 2019-06-25: 100 ug via INTRAVENOUS

## 2019-06-25 MED ORDER — BUPIVACAINE HCL (PF) 0.5 % IJ SOLN
INTRAMUSCULAR | Status: DC | PRN
  Administered 2019-06-25: 8.5 mL

## 2019-06-25 MED ORDER — KETOROLAC TROMETHAMINE 30 MG/ML IJ SOLN
30.0000 mg | Freq: Four times a day (QID) | INTRAMUSCULAR | Status: DC
  Administered 2019-06-25 – 2019-06-27 (×8): 30 mg via INTRAVENOUS
  Filled 2019-06-25 (×8): qty 1

## 2019-06-25 MED ORDER — PROPOFOL 10 MG/ML IV BOLUS
INTRAVENOUS | Status: AC
  Filled 2019-06-25: qty 20

## 2019-06-25 MED ORDER — TRAMADOL HCL 50 MG PO TABS: 50.0000 mg | ORAL_TABLET | Freq: Four times a day (QID) | ORAL | 0 refills | Status: DC | PRN

## 2019-06-25 MED ORDER — TRAMADOL HCL 50 MG PO TABS: 50.0000 mg | ORAL_TABLET | Freq: Once | ORAL | Status: AC

## 2019-06-25 MED ORDER — SODIUM CHLORIDE 0.9 % IV SOLN: INTRAVENOUS | Status: DC

## 2019-06-25 MED ORDER — DOCUSATE SODIUM 100 MG PO CAPS: 100.0000 mg | ORAL_CAPSULE | Freq: Two times a day (BID) | ORAL | 0 refills | Status: AC | PRN

## 2019-06-25 MED ORDER — ONDANSETRON HCL 4 MG/2ML IJ SOLN
4.0000 mg | Freq: Three times a day (TID) | INTRAMUSCULAR | Status: DC | PRN
  Administered 2019-06-25: 19:00:00 4 mg via INTRAVENOUS
  Filled 2019-06-25: qty 2

## 2019-06-25 MED ORDER — MORPHINE SULFATE (PF) 2 MG/ML IV SOLN
1.0000 mg | INTRAVENOUS | Status: DC | PRN
  Administered 2019-06-25 (×2): 1 mg via INTRAVENOUS
  Filled 2019-06-25 (×3): qty 1

## 2019-06-25 MED ORDER — ACETAMINOPHEN 500 MG PO TABS
1000.0000 mg | ORAL_TABLET | ORAL | Status: AC
  Administered 2019-06-25: 12:00:00 1000 mg via ORAL

## 2019-06-25 MED ORDER — DEXAMETHASONE SODIUM PHOSPHATE 10 MG/ML IJ SOLN
INTRAMUSCULAR | Status: DC | PRN
  Administered 2019-06-25: 10 mg via INTRAVENOUS

## 2019-06-25 MED ORDER — TRAMADOL HCL 50 MG PO TABS
50.0000 mg | ORAL_TABLET | Freq: Four times a day (QID) | ORAL | Status: DC | PRN
  Administered 2019-06-26 – 2019-06-27 (×2): 50 mg via ORAL
  Filled 2019-06-25 (×2): qty 1

## 2019-06-25 MED ORDER — FENTANYL CITRATE (PF) 100 MCG/2ML IJ SOLN
25.0000 ug | INTRAMUSCULAR | Status: DC | PRN
  Administered 2019-06-25 (×2): 25 ug via INTRAVENOUS

## 2019-06-25 MED ORDER — FENTANYL CITRATE (PF) 100 MCG/2ML IJ SOLN
INTRAMUSCULAR | Status: AC
  Administered 2019-06-25: 15:00:00 50 ug via INTRAVENOUS
  Filled 2019-06-25: qty 2

## 2019-06-25 MED ORDER — FAMOTIDINE 20 MG PO TABS
20.0000 mg | ORAL_TABLET | Freq: Once | ORAL | Status: AC
  Administered 2019-06-25: 12:00:00 20 mg via ORAL

## 2019-06-25 MED ORDER — LIDOCAINE HCL (CARDIAC) PF 100 MG/5ML IV SOSY
PREFILLED_SYRINGE | INTRAVENOUS | Status: DC | PRN
  Administered 2019-06-25: 100 mg via INTRAVENOUS

## 2019-06-25 MED ORDER — ROCURONIUM BROMIDE 100 MG/10ML IV SOLN
INTRAVENOUS | Status: DC | PRN
  Administered 2019-06-25 (×2): 10 mg via INTRAVENOUS
  Administered 2019-06-25: 50 mg via INTRAVENOUS

## 2019-06-25 MED ORDER — HYDROMORPHONE HCL 1 MG/ML IJ SOLN
INTRAMUSCULAR | Status: AC
  Filled 2019-06-25: qty 1

## 2019-06-25 MED ORDER — CELECOXIB 200 MG PO CAPS
200.0000 mg | ORAL_CAPSULE | ORAL | Status: AC
  Administered 2019-06-25: 12:00:00 200 mg via ORAL

## 2019-06-25 MED ORDER — ACETAMINOPHEN 500 MG PO TABS
1000.0000 mg | ORAL_TABLET | Freq: Four times a day (QID) | ORAL | Status: DC
  Administered 2019-06-26 – 2019-06-27 (×4): 1000 mg via ORAL
  Filled 2019-06-25 (×5): qty 2

## 2019-06-25 MED ORDER — MIDAZOLAM HCL 2 MG/2ML IJ SOLN
INTRAMUSCULAR | Status: AC
  Filled 2019-06-25: qty 2

## 2019-06-25 MED ORDER — KETOROLAC TROMETHAMINE 30 MG/ML IJ SOLN
INTRAMUSCULAR | Status: DC | PRN
  Administered 2019-06-25: 30 mg via INTRAVENOUS

## 2019-06-25 MED ORDER — GABAPENTIN 300 MG PO CAPS
300.0000 mg | ORAL_CAPSULE | ORAL | Status: AC
  Administered 2019-06-25: 12:00:00 300 mg via ORAL

## 2019-06-25 MED ORDER — TRAMADOL HCL 50 MG PO TABS
ORAL_TABLET | ORAL | Status: AC
  Administered 2019-06-25: 18:00:00 50 mg via ORAL
  Filled 2019-06-25: qty 1

## 2019-06-25 MED ORDER — PROMETHAZINE HCL 25 MG/ML IJ SOLN
INTRAMUSCULAR | Status: AC
  Filled 2019-06-25: qty 1

## 2019-06-25 MED ORDER — HYDROMORPHONE HCL 1 MG/ML IJ SOLN
INTRAMUSCULAR | Status: DC | PRN
  Administered 2019-06-25: .2 mg via INTRAVENOUS
  Administered 2019-06-25: .4 mg via INTRAVENOUS
  Administered 2019-06-25 (×2): .2 mg via INTRAVENOUS

## 2019-06-25 MED ORDER — IBUPROFEN 800 MG PO TABS
800.0000 mg | ORAL_TABLET | Freq: Three times a day (TID) | ORAL | 0 refills | Status: DC | PRN
Start: 2019-06-25 — End: 2021-10-17

## 2019-06-25 MED ORDER — CHLORHEXIDINE GLUCONATE CLOTH 2 % EX PADS: 6.0000 | MEDICATED_PAD | Freq: Once | CUTANEOUS | Status: DC

## 2019-06-25 MED ORDER — ENOXAPARIN SODIUM 40 MG/0.4ML ~~LOC~~ SOLN
40.0000 mg | SUBCUTANEOUS | Status: DC
  Filled 2019-06-25: qty 0.4

## 2019-06-25 MED ORDER — SUGAMMADEX SODIUM 500 MG/5ML IV SOLN
INTRAVENOUS | Status: DC | PRN
  Administered 2019-06-25: 172.4 mg via INTRAVENOUS

## 2019-06-25 MED ORDER — ACETAMINOPHEN 325 MG PO TABS
650.0000 mg | ORAL_TABLET | Freq: Three times a day (TID) | ORAL | 0 refills | Status: AC | PRN
Start: 2019-06-25 — End: 2019-07-25

## 2019-06-25 MED ORDER — PROPOFOL 10 MG/ML IV BOLUS
INTRAVENOUS | Status: DC | PRN
  Administered 2019-06-25: 200 mg via INTRAVENOUS

## 2019-06-25 MED ORDER — PROMETHAZINE HCL 25 MG/ML IJ SOLN
12.5000 mg | Freq: Four times a day (QID) | INTRAMUSCULAR | Status: DC | PRN
  Administered 2019-06-25 – 2019-06-26 (×3): 12.5 mg via INTRAVENOUS
  Filled 2019-06-25 (×3): qty 1

## 2019-06-25 MED ORDER — FENTANYL CITRATE (PF) 100 MCG/2ML IJ SOLN
INTRAMUSCULAR | Status: AC
  Filled 2019-06-25: qty 2

## 2019-06-25 MED ORDER — MIDAZOLAM HCL 2 MG/2ML IJ SOLN
INTRAMUSCULAR | Status: DC | PRN
  Administered 2019-06-25: 2 mg via INTRAVENOUS

## 2019-06-25 MED ORDER — ONDANSETRON HCL 4 MG/2ML IJ SOLN
INTRAMUSCULAR | Status: DC | PRN
  Administered 2019-06-25: 4 mg via INTRAVENOUS

## 2019-06-25 MED ORDER — BUPIVACAINE HCL (PF) 0.5 % IJ SOLN
INTRAMUSCULAR | Status: AC
  Filled 2019-06-25: qty 30

## 2019-06-25 MED ORDER — LIDOCAINE-EPINEPHRINE (PF) 1 %-1:200000 IJ SOLN
INTRAMUSCULAR | Status: AC
  Filled 2019-06-25: qty 30

## 2019-06-25 MED ORDER — SODIUM CHLORIDE 0.9 % IV SOLN
8.0000 mg | Freq: Three times a day (TID) | INTRAVENOUS | Status: DC | PRN
  Filled 2019-06-25: qty 4

## 2019-06-25 MED ORDER — PROMETHAZINE HCL 25 MG/ML IJ SOLN
6.2500 mg | INTRAMUSCULAR | Status: AC | PRN
  Administered 2019-06-25: 15:00:00 6.25 mg via INTRAVENOUS

## 2019-06-25 SURGICAL SUPPLY — 57 items
"PENCIL ELECTRO HAND CTR " (MISCELLANEOUS) ×2 IMPLANT
ANCHOR TIS RET SYS 235ML (MISCELLANEOUS) ×4 IMPLANT
BAG INFUSER PRESSURE 100CC (MISCELLANEOUS) ×2 IMPLANT
BLADE SURG SZ11 CARB STEEL (BLADE) ×4 IMPLANT
CANISTER SUCT 1200ML W/VALVE (MISCELLANEOUS) ×4 IMPLANT
CANNULA REDUC XI 12-8 STAPL (CANNULA) ×1
CANNULA REDUC XI 12-8MM STAPL (CANNULA) ×1
CANNULA REDUCER 12-8 DVNC XI (CANNULA) ×2 IMPLANT
CHLORAPREP W/TINT 26 (MISCELLANEOUS) ×4 IMPLANT
CLIP VESOLOCK MED LG 6/CT (CLIP) ×4 IMPLANT
COVER TIP SHEARS 8 DVNC (MISCELLANEOUS) ×2 IMPLANT
COVER TIP SHEARS 8MM DA VINCI (MISCELLANEOUS) ×2
COVER WAND RF STERILE (DRAPES) ×4 IMPLANT
DECANTER SPIKE VIAL GLASS SM (MISCELLANEOUS) ×8 IMPLANT
DEFOGGER SCOPE WARMER CLEARIFY (MISCELLANEOUS) ×4 IMPLANT
DERMABOND ADVANCED (GAUZE/BANDAGES/DRESSINGS) ×2
DERMABOND ADVANCED .7 DNX12 (GAUZE/BANDAGES/DRESSINGS) ×2 IMPLANT
DRAPE ARM DVNC X/XI (DISPOSABLE) ×8 IMPLANT
DRAPE COLUMN DVNC XI (DISPOSABLE) ×2 IMPLANT
DRAPE DA VINCI XI ARM (DISPOSABLE) ×8
DRAPE DA VINCI XI COLUMN (DISPOSABLE) ×2
ELECT CAUTERY BLADE 6.4 (BLADE) ×4 IMPLANT
ELECT REM PT RETURN 9FT ADLT (ELECTROSURGICAL) ×4
ELECTRODE REM PT RTRN 9FT ADLT (ELECTROSURGICAL) ×2 IMPLANT
GLOVE BIOGEL PI IND STRL 7.0 (GLOVE) ×4 IMPLANT
GLOVE BIOGEL PI INDICATOR 7.0 (GLOVE) ×4
GLOVE SURG SYN 6.5 ES PF (GLOVE) ×8 IMPLANT
GLOVE SURG SYN 6.5 PF PI (GLOVE) ×4 IMPLANT
GOWN STRL REUS W/ TWL LRG LVL3 (GOWN DISPOSABLE) ×6 IMPLANT
GOWN STRL REUS W/TWL LRG LVL3 (GOWN DISPOSABLE) ×6
GRASPER SUT TROCAR 14GX15 (MISCELLANEOUS) ×2 IMPLANT
IRRIGATOR SUCT 8 DISP DVNC XI (IRRIGATION / IRRIGATOR) IMPLANT
IRRIGATOR SUCTION 8MM XI DISP (IRRIGATION / IRRIGATOR) ×2
IV NS 1000ML (IV SOLUTION) ×2
IV NS 1000ML BAXH (IV SOLUTION) IMPLANT
L-HOOK LAP DISP 36CM (ELECTROSURGICAL) ×4
LABEL OR SOLS (LABEL) ×4 IMPLANT
LHOOK LAP DISP 36CM (ELECTROSURGICAL) IMPLANT
NDL INSUFFLATION 14GA 120MM (NEEDLE) ×2 IMPLANT
NEEDLE HYPO 22GX1.5 SAFETY (NEEDLE) ×4 IMPLANT
NEEDLE INSUFFLATION 14GA 120MM (NEEDLE) ×4 IMPLANT
NS IRRIG 500ML POUR BTL (IV SOLUTION) ×4 IMPLANT
OBTURATOR OPTICAL STANDARD 8MM (TROCAR) ×2
OBTURATOR OPTICAL STND 8 DVNC (TROCAR) ×2
OBTURATOR OPTICALSTD 8 DVNC (TROCAR) ×2 IMPLANT
PACK LAP CHOLECYSTECTOMY (MISCELLANEOUS) ×4 IMPLANT
PENCIL ELECTRO HAND CTR (MISCELLANEOUS) ×4 IMPLANT
SEAL CANN UNIV 5-8 DVNC XI (MISCELLANEOUS) ×6 IMPLANT
SEAL XI 5MM-8MM UNIVERSAL (MISCELLANEOUS) ×6
SET TUBE SMOKE EVAC HIGH FLOW (TUBING) ×4 IMPLANT
SOLUTION ELECTROLUBE (MISCELLANEOUS) ×4 IMPLANT
STAPLER CANNULA SEAL DVNC XI (STAPLE) ×2 IMPLANT
STAPLER CANNULA SEAL XI (STAPLE) ×2
SUT MNCRL 4-0 (SUTURE) ×4
SUT MNCRL 4-0 27XMFL (SUTURE) ×4
SUT VICRYL 0 AB UR-6 (SUTURE) ×4 IMPLANT
SUTURE MNCRL 4-0 27XMF (SUTURE) ×4 IMPLANT

## 2019-06-25 NOTE — Transfer of Care (Signed)
Immediate Anesthesia Transfer of Care Note  Patient: Stacey Howe  Procedure(s) Performed: XI ROBOTIC ASSISTED LAPAROSCOPIC CHOLECYSTECTOMY (N/A Abdomen) INDOCYANINE GREEN FLUORESCENCE IMAGING (ICG)  Patient Location: PACU  Anesthesia Type:General  Level of Consciousness: awake, alert  and oriented  Airway & Oxygen Therapy: Patient Spontanous Breathing and Patient connected to face mask oxygen  Post-op Assessment: Report given to RN and Post -op Vital signs reviewed and stable  Post vital signs: Reviewed and stable  Last Vitals:  Vitals Value Taken Time  BP 134/93 06/25/19 1434  Temp    Pulse 73 06/25/19 1436  Resp 19 06/25/19 1436  SpO2 100 % 06/25/19 1436  Vitals shown include unvalidated device data.  Last Pain:  Vitals:   06/25/19 1222  TempSrc: Temporal  PainSc: 0-No pain         Complications: No apparent anesthesia complications

## 2019-06-25 NOTE — Anesthesia Procedure Notes (Signed)
Procedure Name: Intubation Date/Time: 06/25/2019 12:50 PM Performed by: Willette Alma, CRNA Pre-anesthesia Checklist: Patient identified, Patient being monitored, Timeout performed, Emergency Drugs available and Suction available Patient Re-evaluated:Patient Re-evaluated prior to induction Oxygen Delivery Method: Circle system utilized Preoxygenation: Pre-oxygenation with 100% oxygen Induction Type: IV induction Ventilation: Mask ventilation without difficulty Laryngoscope Size: Mac and 3 Grade View: Grade I Tube type: Oral Tube size: 7.0 mm Number of attempts: 1 Airway Equipment and Method: Stylet Placement Confirmation: ETT inserted through vocal cords under direct vision,  positive ETCO2 and breath sounds checked- equal and bilateral Secured at: 21 cm Tube secured with: Tape Dental Injury: Teeth and Oropharynx as per pre-operative assessment

## 2019-06-25 NOTE — H&P (Signed)
Subjective:   CC: post op pain  HPI:  Stacey Howe is a 39 y.o. female who is admitted for above.  S/p uneventful robo lap chole.  pain intolerable per report from RN.  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  Past Surgical History:  has a past surgical history that includes Cesarean section (2000); Cesarean section (2015); Tubal ligation; and Mandible fracture surgery (2019).  Family History: family history includes Arthritis in her father; Asthma in her mother; Cancer in her maternal aunt; Drug abuse in her mother; HIV in her mother.  Social History:  reports that she has been smoking cigarettes. She has a 7.50 pack-year smoking history. She has never used smokeless tobacco. She reports previous drug use. Frequency: 7.00 times per week. Drug: Marijuana. She reports that she does not drink alcohol.  Current Medications:  Medications Prior to Admission  Medication Sig Dispense Refill  . acetaminophen (TYLENOL) 500 MG tablet Take 1,000 mg by mouth every 6 (six) hours as needed for moderate pain.    Marland Kitchen ondansetron (ZOFRAN) 4 MG tablet Take 1 tablet (4 mg total) by mouth every 8 (eight) hours as needed. 20 tablet 0  . buprenorphine-naloxone (SUBOXONE) 8-2 mg SUBL SL tablet Place 1 tablet under the tongue 2 (two) times daily.    Marland Kitchen dicyclomine (BENTYL) 10 MG capsule Take 1 capsule (10 mg total) by mouth 4 (four) times daily as needed for up to 14 days (abd pain). (Patient not taking: Reported on 06/17/2019) 56 capsule 0  . QUEtiapine (SEROQUEL) 200 MG tablet Take 200 mg by mouth at bedtime.    . sertraline (ZOLOFT) 100 MG tablet Take 100 mg by mouth daily.      Allergies:  Allergies as of 06/15/2019 - Review  Complete 06/15/2019  Allergen Reaction Noted  . Vicodin [hydrocodone-acetaminophen] Anaphylaxis 12/08/2014    ROS:  General: Denies weight loss, weight gain, fatigue, fevers, chills, and night sweats. Eyes: Denies blurry vision, double vision, eye pain, itchy eyes, and tearing. Ears: Denies hearing loss, earache, and ringing in ears. Nose: Denies sinus pain, congestion, infections, runny nose, and nosebleeds. Mouth/throat: Denies hoarseness, sore throat, bleeding gums, and difficulty swallowing. Heart: Denies chest pain, palpitations, racing heart, irregular heartbeat, leg pain or swelling, and decreased activity tolerance. Respiratory: Denies breathing difficulty, shortness of breath, wheezing, cough, and sputum. GI: Denies change in appetite, heartburn, nausea, vomiting, constipation, diarrhea, and blood in stool. GU: Denies difficulty urinating, pain with urinating, urgency, frequency, blood in urine. Musculoskeletal: Denies joint stiffness, pain, swelling, muscle weakness. Skin: Denies rash, itching, mass, tumors, sores, and boils Neurologic: Denies headache, fainting, dizziness, seizures, numbness, and tingling. Psychiatric: Denies depression, anxiety, difficulty sleeping, and memory loss. Endocrine: Denies heat or cold intolerance, and increased thirst or urination. Blood/lymph: Denies easy bruising, and swollen glands     Objective:     BP (!) 142/98 (BP Location: Left Arm)   Pulse 94   Temp 99.1 F (37.3 C) (Oral)   Resp 16   Ht 5\' 5"  (1.651 m)   Wt 86.2 kg   LMP 06/02/2019 (Exact Date)   SpO2 100%   BMI 31.62 kg/m   LABS:  CMP Latest Ref Rng & Units 06/23/2019 05/22/2019 11/05/2018  Glucose 70 - 99 mg/dL 78 127(H) 123(H)  BUN 6 - 20 mg/dL 6 9 6   Creatinine 0.44 - 1.00 mg/dL 6.56 8.12  Sodium 135 - 145 mmol/L 139 138 136  Potassium 3.5 - 5.1 mmol/L 3.4(L) 3.4(L) 3.4(L)  Chloride 98 - 111 mmol/L 106 101 99  CO2 22 - 32 mmol/L 26 23 24   Calcium 8.9 - 10.3 mg/dL  9.1 7.51 9.5  Total Protein 6.5 - 8.1 g/dL - 9.4(H) 8.3(H)  Total Bilirubin 0.3 - 1.2 mg/dL - 1.1 0.8  Alkaline Phos 38 - 126 U/L - 79 65  AST 15 - 41 U/L - 21 21  ALT 0 - 44 U/L - 12 13   CBC Latest Ref Rng & Units 06/23/2019 05/22/2019 11/05/2018  WBC 4.0 - 10.5 K/uL 7.2 8.0 11.1(H)  Hemoglobin 12.0 - 15.0 g/dL 07/22/2019 11/07/2018 17.4  Hematocrit 36.0 - 46.0 % 37.4 41.8 41.7  Platelets 150 - 400 K/uL 311 290 248     RADS: n/a Assessment:      S/p robo lap chole with 10/10 pain although  Plan:     Called by post op RN re: pain control issues.  Vitals stable, patient states pain in unbearable and needs oxy because that is the only thing that works.  She was asleep comfortably prior to all of this.  I explained to her that post op lap chole patients should not be in the amount of pain she is in, so I recommended observation for close monitoring and better pain control.  She was notified no bed was available and will have to use stretcher until one becomes available, supposedly got upset about this so now requesting to go home.  I spoke to her directly explaining above and she still requesting to be sent home, stating she cannot stay on a stretcher and requires a pillow.  I explained to her that I will be documenting our conversation as such.  All questions answered at this time  Called back by RN and patient changed mind and decided to stay.  Orders placed.  NPO, repeat CBC tonight, IVF, pain control in the meantime

## 2019-06-25 NOTE — Progress Notes (Signed)
Called by post op RN re: pain control issues.  Vitals stable, patient states pain in unbearable and needs oxy because that is the only thing that works.  She was asleep comfortably prior to all of this.  I explained to her that post op lap chole patients should not be in the amount of pain she is in, so I recommended observation for close monitoring and better pain control.  She was notified no bed was available and will have to use stretcher until one becomes available, supposedly got upset about this so now requesting to go home.  I spoke to her directly explaining above and she still requesting to be sent home, stating she cannot stay on a stretcher and requires a pillow.  I explained to her that I will be documenting our conversation as such.  All questions answered at this time

## 2019-06-25 NOTE — Interval H&P Note (Signed)
History and Physical Interval Note:  06/25/2019 12:25 PM  Stacey Howe  has presented today for surgery, with the diagnosis of K82.8 Biliary dyskinesia.  The various methods of treatment have been discussed with the patient and family. After consideration of risks, benefits and other options for treatment, the patient has consented to  Procedure(s): XI ROBOTIC ASSISTED LAPAROSCOPIC CHOLECYSTECTOMY (N/A) as a surgical intervention.  The patient's history has been reviewed, patient examined, no change in status, stable for surgery.  I have reviewed the patient's chart and labs.  Questions were answered to the patient's satisfaction.    Biliary dyskinesia noted, but still no guarantees pain will improve.  Pt verbalized understanding, still wishes to proceed  Sung Amabile

## 2019-06-25 NOTE — Progress Notes (Signed)
Attempted to complete patient admission profile. Pt refusing at this time. Patients nurse notified.

## 2019-06-25 NOTE — Anesthesia Preprocedure Evaluation (Signed)
Anesthesia Evaluation  Patient identified by MRN, date of birth, ID band Patient awake    Reviewed: Allergy & Precautions, H&P , NPO status , Patient's Chart, lab work & pertinent test results, reviewed documented beta blocker date and time   History of Anesthesia Complications Negative for: history of anesthetic complications  Airway Mallampati: II  TM Distance: >3 FB Neck ROM: full    Dental  (+) Missing, Dental Advidsory Given   Pulmonary neg shortness of breath, neg COPD, neg recent URI, Current Smoker,    Pulmonary exam normal breath sounds clear to auscultation       Cardiovascular Exercise Tolerance: Good negative cardio ROS Normal cardiovascular exam Rhythm:regular Rate:Normal     Neuro/Psych PSYCHIATRIC DISORDERS Anxiety Bipolar Disorder negative neurological ROS     GI/Hepatic negative GI ROS, Neg liver ROS,   Endo/Other  negative endocrine ROS  Renal/GU CRFRenal disease  negative genitourinary   Musculoskeletal   Abdominal   Peds  Hematology negative hematology ROS (+)   Anesthesia Other Findings Past Medical History: 2007: Bipolar affective disorder (HCC)     Comment:  does not take meds as she states she does not need them               any more No date: Chronic kidney disease     Comment:  frequent uti No date: Chronic pain syndrome 2014: Drug abuse, opioid type (HCC)     Comment:  Current remission 09/08/14: Pyelonephritis 2016: Rheumatoid arthritis (HCC) No date: Sickle cell trait (HCC) No date: Syphilis     Comment:  history of syphilis, has been treated   Reproductive/Obstetrics negative OB ROS                             Anesthesia Physical Anesthesia Plan  ASA: II  Anesthesia Plan: General   Post-op Pain Management:    Induction: Intravenous  PONV Risk Score and Plan: 2 and Ondansetron, Dexamethasone, Midazolam, Promethazine and Treatment may vary due  to age or medical condition  Airway Management Planned: Oral ETT  Additional Equipment:   Intra-op Plan:   Post-operative Plan: Extubation in OR  Informed Consent: I have reviewed the patients History and Physical, chart, labs and discussed the procedure including the risks, benefits and alternatives for the proposed anesthesia with the patient or authorized representative who has indicated his/her understanding and acceptance.     Dental Advisory Given  Plan Discussed with: Anesthesiologist, CRNA and Surgeon  Anesthesia Plan Comments:         Anesthesia Quick Evaluation

## 2019-06-25 NOTE — Op Note (Signed)
Preoperative diagnosis:  biliary dyskinesia  Postoperative diagnosis: Chronic cholecystitis  Procedure: Robotic assisted Laparoscopic Cholecystectomy.   Anesthesia: GETA   Surgeon: Sung Amabile  Specimen: Gallbladder  Complications: None  EBL: 60mL  Wound Classification: Clean Contaminated  Indications: see HPI  Findings: Critical view of safety noted Cystic duct and artery identified, ligated and divided, clips remained intact at end of procedure Adequate hemostasis  Description of procedure:  The patient was placed on the operating table in the supine position. SCDs placed, pre-op abx administered.  General anesthesia was induced and OG tube placed by anesthesia. A time-out was completed verifying correct patient, procedure, site, positioning, and implant(s) and/or special equipment prior to beginning this procedure. The abdomen was prepped and draped in the usual sterile fashion.    Veress needle was placed at the Palmer's point and insufflation was started after confirming a positive saline drop test and no immediate increase in abdominal pressure.  After reaching 15 mm, the Veress needle was removed and a 8 mm port was placed via optiview technique under umbilicus measured 65mm from gallbladder.  The abdomen was inspected and no abnormalities or injuries were found.  Under direct vision, ports were placed in the following locations: One 12 mm patient left of the umbilicus, 8cm from the optiviewed port, one 8 mm port placed to the patient right of the umbilical port 8 cm apart.  1 additional 8 mm port placed lateral to the 22mm port.  Once ports were placed, The table was placed in the reverse Trendelenburg position with the right side up. The Xi platform was brought into the operative field and docked to the ports successfully.  An endoscope was placed through the umbilical port, fenestrated grasper through the adjacent patient right port, prograsp to the far patient left port, and  then a hook cautery in the left port.  The dome of the gallbladder was grasped with prograsp, passed and retracted over the dome of the liver. Adhesions between the gallbladder and omentum, duodenum and transverse colon were lysed via hook cautery. The infundibulum was grasped with the fenestrated grasper and retracted toward the right lower quadrant. This maneuver exposed Calot's triangle. The peritoneum overlying the gallbladder infundibulum was then dissected using combination of [place] dissector and electrocautery hook and the cystic duct and cystic artery identified.  Critical view of safety with the liver bed clearly visible behind the duct and artery with no additional structures noted.  The cystic duct and cystic artery clipped and divided close to the gallbladder.  Of note, the cystic artery was noted to be very short and the clip was placed right adjacent to the common hepatic artery.  Hemostasis was noted and no evidence of clip dislodgment was noted throughout the entire procedure.   The gallbladder was then dissected from its peritoneal and liver bed attachments by electrocautery. Hemostasis was checked prior to removing the hook cautery and the Endo Catch bag was then placed through the 12 mm port and the gallbladder was removed.  The gallbladder was passed off the table as a specimen. There was no evidence of bleeding from the gallbladder fossa or cystic artery or leakage of the bile from the cystic duct stump.  Any biliary leakage during the gallbladder removal of the liver fossa was copiously irrigated and suctioned out.  The 12 mm port site closed with PMI using 0 vicryl under direct vision.  Abdomen desufflated and secondary trocars were removed under direct vision. No bleeding was noted. All skin incisions  then closed with subcuticular sutures of 4-0 monocryl and dressed with topical skin adhesive. The orogastric tube was removed and patient extubated.  The patient tolerated the procedure  well and was taken to the postanesthesia care unit in stable condition.  All sponge and instrument count correct at end of procedure.

## 2019-06-25 NOTE — Progress Notes (Addendum)
Pt into post op, up to chair with standby assist. Pt reports 10 out of 10 pain and states home discharge medication of tramadol will not bet effective. Dr. Tonna Boehringer notified. MD spoke with patient, suggested admission. Pt agreeable. Informed would need to wait for room to become available. Pt then states she would go home. Notified Dr. Tonna Boehringer who again spoke with patient who is now agreeable to admission. Tramadol 50mg  given per MD. NPO until further notice. Pt in bed, relaxed look on face, sleeping between care. No signs of distress at this time. VSS. Will continue to assess.  Brother, , notified pt will be admitted. Marquita Palms verbalized understanding.

## 2019-06-25 NOTE — Discharge Instructions (Signed)
Laparoscopic Cholecystectomy, Care After This sheet gives you information about how to care for yourself after your procedure. Your doctor may also give you more specific instructions. If you have problems or questions, contact your doctor. Follow these instructions at home: Care for cuts from surgery (incisions)   Follow instructions from your doctor about how to take care of your cuts from surgery. Make sure you: ? Wash your hands with soap and water before you change your bandage (dressing). If you cannot use soap and water, use hand sanitizer. ? Change your bandage as told by your doctor. ? Leave stitches (sutures), skin glue, or skin tape (adhesive) strips in place. They may need to stay in place for 2 weeks or longer. If tape strips get loose and curl up, you may trim the loose edges. Do not remove tape strips completely unless your doctor says it is okay.  Do not take baths, swim, or use a hot tub until your doctor says it is okay. OK TO SHOWER 24HRS AFTER YOUR SURGERY.   Check your surgical cut area every day for signs of infection. Check for: ? More redness, swelling, or pain. ? More fluid or blood. ? Warmth. ? Pus or a bad smell. Activity  Do not drive or use heavy machinery while taking prescription pain medicine.  Do not play contact sports until your doctor says it is okay.  Do not drive for 24 hours if you were given a medicine to help you relax (sedative).  Rest as needed. Do not return to work or school until your doctor says it is okay. General instructions .  tylenol and advil as needed for discomfort.  Please alternate between the two every four hours as needed for pain.   .  Use narcotics, if prescribed, only when tylenol and motrin is not enough to control pain. .  325-650mg every 8hrs to max of 3000mg/24hrs (including the 325mg in every norco dose) for the tylenol.   .  Advil up to 800mg per dose every 8hrs as needed for pain.    To prevent or treat constipation  while you are taking prescription pain medicine, your doctor may recommend that you: ? Drink enough fluid to keep your pee (urine) clear or pale yellow. ? Take over-the-counter or prescription medicines. ? Eat foods that are high in fiber, such as fresh fruits and vegetables, whole grains, and beans. ? Limit foods that are high in fat and processed sugars, such as fried and sweet foods. Contact a doctor if:  You develop a rash.  You have more redness, swelling, or pain around your surgical cuts.  You have more fluid or blood coming from your surgical cuts.  Your surgical cuts feel warm to the touch.  You have pus or a bad smell coming from your surgical cuts.  You have a fever.  One or more of your surgical cuts breaks open. Get help right away if:  You have trouble breathing.  You have chest pain.  You have pain that is getting worse in your shoulders.  You faint or feel dizzy when you stand.  You have very bad pain in your belly (abdomen).  You are sick to your stomach (nauseous) for more than one day.  You have throwing up (vomiting) that lasts for more than one day.  You have leg pain. This information is not intended to replace advice given to you by your health care provider. Make sure you discuss any questions you have with your   health care provider. Document Released: 11/14/2007 Document Revised: 08/26/2015 Document Reviewed: 07/24/2015 Elsevier Interactive Patient Education  2019 [employer]    AMBULATORY SURGERY  DISCHARGE INSTRUCTIONS   1) The drugs that you were given will stay in your system until tomorrow so for the next 24 hours you should not:  A) Drive an automobile B) Make any legal decisions C) Drink any alcoholic beverage   2) You may resume regular meals tomorrow.  Today it is better to start with liquids and gradually work up to solid foods.  You may eat anything you prefer, but it is better to start with liquids, then soup and  crackers, and gradually work up to solid foods.   3) Please notify your doctor immediately if you have any unusual bleeding, trouble breathing, redness and pain at the surgery site, drainage, fever, or pain not relieved by medication. 4)   5) Your post-operative visit with Dr.                                     is: Date:                        Time:    Please call to schedule your post-operative visit.  6) Additional Instructions:

## 2019-06-26 DIAGNOSIS — K8010 Calculus of gallbladder with chronic cholecystitis without obstruction: Secondary | ICD-10-CM | POA: Diagnosis not present

## 2019-06-26 LAB — BASIC METABOLIC PANEL
Anion gap: 10 (ref 5–15)
BUN: 6 mg/dL (ref 6–20)
CO2: 24 mmol/L (ref 22–32)
Calcium: 9.5 mg/dL (ref 8.9–10.3)
Chloride: 104 mmol/L (ref 98–111)
Creatinine, Ser: 0.81 mg/dL (ref 0.44–1.00)
GFR calc Af Amer: >60 mL/min (ref >60)
GFR calc non Af Amer: >60 mL/min (ref >60)
Glucose, Bld: 107 mg/dL — ABNORMAL HIGH (ref 70–99)
Potassium: 3.9 mmol/L (ref 3.5–5.1)
Sodium: 138 mmol/L (ref 135–145)

## 2019-06-26 LAB — CBC
HCT: 40.7 % (ref 36.0–46.0)
Hemoglobin: 13.5 g/dL (ref 12.0–15.0)
MCH: 26.2 pg (ref 26.0–34.0)
MCHC: 33.2 g/dL (ref 30.0–36.0)
MCV: 79 fL — ABNORMAL LOW (ref 80.0–100.0)
Platelets: 306 10*3/uL (ref 150–400)
RBC: 5.15 MIL/uL — ABNORMAL HIGH (ref 3.87–5.11)
RDW: 13.7 % (ref 11.5–15.5)
WBC: 13.9 10*3/uL — ABNORMAL HIGH (ref 4.0–10.5)
nRBC: 0 % (ref 0.0–0.2)

## 2019-06-26 MED ORDER — ONDANSETRON HCL 4 MG/2ML IJ SOLN
4.0000 mg | Freq: Three times a day (TID) | INTRAMUSCULAR | Status: DC | PRN
  Administered 2019-06-26 (×3): 4 mg via INTRAVENOUS
  Filled 2019-06-26 (×3): qty 2
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
## 2019-06-27 DIAGNOSIS — K8010 Calculus of gallbladder with chronic cholecystitis without obstruction: Secondary | ICD-10-CM | POA: Diagnosis not present

## 2019-06-27 MED ORDER — DOCUSATE SODIUM 100 MG PO CAPS
100.0000 mg | ORAL_CAPSULE | Freq: Two times a day (BID) | ORAL | Status: DC
  Administered 2019-06-27: 01:00:00 100 mg via ORAL
  Filled 2019-06-27: qty 1

## 2019-06-27 NOTE — Discharge Summary (Signed)
   Patient ID: Stacey Howe MRN: [id number] DOB/AGE: 05-07-1980 39 y.o.  Admit date: 06/25/2019 Discharge date: 06/27/2019   Discharge Diagnoses:  Active Problems:   Acute cholecystitis   Procedures: Robotic cholecystectomy  Hospital Course:   Admitted following robotic cholecystectomy for pain control and nausea.  Patient was kept over 2 nights.  The time of discharge the patient was ambulating,  pain was controlled.  Her vital signs were stable and she was afebrile.   physical exam at discharge showed a pt  in no acute distress.  Awake and alert.  Abdomen: Soft incisions healing well without infection or peritonitis.  Extremities well-perfused and no edema.  Condition of the patient the time of discharge was stable Morphine was a likely cause of the nausea, and phenergan was helpful.   Consults: none  Disposition: Discharge disposition: 01-Home or Self Care        Allergies as of 06/27/2019      Reactions   Vicodin [hydrocodone-acetaminophen] Anaphylaxis   Morphine And Related Nausea And Vomiting      Medication List    STOP taking these medications   dicyclomine 10 MG capsule Commonly known as: Bentyl     TAKE these medications   acetaminophen 325 MG tablet Commonly known as: Tylenol Take 2 tablets (650 mg total) by mouth every 8 (eight) hours as needed for mild pain. What changed:   medication strength  how much to take  when to take this  reasons to take this   buprenorphine-naloxone 8-2 mg Subl SL tablet Commonly known as: SUBOXONE Place 1 tablet under the tongue 2 (two) times daily.   docusate sodium 100 MG capsule Commonly known as: Colace Take 1 capsule (100 mg total) by mouth 2 (two) times daily as needed for up to 10 days for mild constipation.   ibuprofen 800 MG tablet Commonly known as: ADVIL Take 1 tablet (800 mg total) by mouth every 8 (eight) hours as needed for mild pain or moderate pain.   ondansetron 4 MG tablet Commonly known  as: Zofran Take 1 tablet (4 mg total) by mouth every 8 (eight) hours as needed.   QUEtiapine 200 MG tablet Commonly known as: SEROQUEL Take 200 mg by mouth at bedtime.   sertraline 100 MG tablet Commonly known as: ZOLOFT Take 100 mg by mouth daily.   traMADol 50 MG tablet Commonly known as: Ultram Take 1 tablet (50 mg total) by mouth every 6 (six) hours as needed for up to 6 doses.      Follow-up Information    Follow up In 2 weeks.   Why: post op lap chole       Sakai, Isami, DO Follow up in 2 week(s).   Specialty: Surgery Why: please call monday to schedule follow up appointment for 2 weeks. Contact information: [address] [place] [address] [phone]

## 2019-06-27 NOTE — Progress Notes (Signed)
Stacey Howe to be D/C'd Home per MD order.  Discussed prescriptions and follow up appointments with the patient. Prescriptions given to patient, medication list explained in detail. Pt verbalized understanding.  Allergies as of 06/27/2019      Reactions   Vicodin [hydrocodone-acetaminophen] Anaphylaxis   Morphine And Related Nausea And Vomiting      Medication List    STOP taking these medications   dicyclomine 10 MG capsule Commonly known as: Bentyl     TAKE these medications   acetaminophen 325 MG tablet Commonly known as: Tylenol Take 2 tablets (650 mg total) by mouth every 8 (eight) hours as needed for mild pain. What changed:   medication strength  how much to take  when to take this  reasons to take this   buprenorphine-naloxone 8-2 mg Subl SL tablet Commonly known as: SUBOXONE Place 1 tablet under the tongue 2 (two) times daily.   docusate sodium 100 MG capsule Commonly known as: Colace Take 1 capsule (100 mg total) by mouth 2 (two) times daily as needed for up to 10 days for mild constipation.   ibuprofen 800 MG tablet Commonly known as: ADVIL Take 1 tablet (800 mg total) by mouth every 8 (eight) hours as needed for mild pain or moderate pain.   ondansetron 4 MG tablet Commonly known as: Zofran Take 1 tablet (4 mg total) by mouth every 8 (eight) hours as needed.   QUEtiapine 200 MG tablet Commonly known as: SEROQUEL Take 200 mg by mouth at bedtime.   sertraline 100 MG tablet Commonly known as: ZOLOFT Take 100 mg by mouth daily.   traMADol 50 MG tablet Commonly known as: Ultram Take 1 tablet (50 mg total) by mouth every 6 (six) hours as needed for up to 6 doses.       Vitals:   06/27/19 0304 06/27/19 1212  BP: 108/71 (!) 100/57  Pulse: 68 (!) 52  Resp: 16 16  Temp: 98.2 F (36.8 C) 98.6 F (37 C)  SpO2: 100%     Skin clean, dry and intact without evidence of skin break down, no evidence of skin tears noted. IV catheter discontinued  intact. Site without signs and symptoms of complications. Dressing and pressure applied. Pt denies pain at this time. No complaints noted.  An After Visit Summary was printed and given to the patient. Patient escorted via WC, and D/C home via private auto.  Madie Reno, RN

## 2019-06-28 NOTE — Anesthesia Postprocedure Evaluation (Signed)
Anesthesia Post Note  Patient: Stacey Howe  Procedure(s) Performed: XI ROBOTIC ASSISTED LAPAROSCOPIC CHOLECYSTECTOMY (N/A Abdomen) INDOCYANINE GREEN FLUORESCENCE IMAGING (ICG)  Patient location during evaluation: PACU Anesthesia Type: General Level of consciousness: awake and alert Pain management: pain level controlled Vital Signs Assessment: post-procedure vital signs reviewed and stable Respiratory status: spontaneous breathing, nonlabored ventilation, respiratory function stable and patient connected to nasal cannula oxygen Cardiovascular status: blood pressure returned to baseline and stable Postop Assessment: no apparent nausea or vomiting Anesthetic complications: no     Last Vitals:  Vitals:   06/27/19 0304 06/27/19 1212  BP: 108/71 (!) 100/57  Pulse: 68 (!) 52  Resp: 16 16  Temp: 36.8 C 37 C  SpO2: 100%     Last Pain:  Vitals:   06/27/19 1212  TempSrc: Oral  PainSc:                  Lenard Simmer

## 2019-06-29 LAB — SURGICAL PATHOLOGY

## 2019-08-27 ENCOUNTER — Encounter: Payer: Self-pay | Admitting: Surgery

## 2020-11-23 IMAGING — NM NM HEPATO W/GB/PHARM/[PERSON_NAME]
2 series · 12 of 12 positions shown · non-contrast
Comparison: Abdomen ultrasound, 05/22/2019, which demonstrated
multiple gallstones.

CLINICAL DATA: Biliary colic.

EXAM:
NUCLEAR MEDICINE HEPATOBILIARY IMAGING WITH GALLBLADDER EF
TECHNIQUE: Sequential images of the abdomen were obtained [DATE] minutes
following intravenous administration of radiopharmaceutical. After
oral ingestion of Ensure, gallbladder ejection fraction was
determined. At 60 min, normal ejection fraction is greater than 33%.
RADIOPHARMACEUTICALS:  5.16 mCi Hc-SSm  Choletec IV

[Series 1000: hepatobiliary scan · 9.59mm/px · 6 of 60 frames shown]
[frame 6/60]
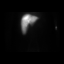
[frame 16/60]
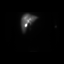
[frame 26/60]
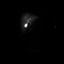
[frame 36/60]
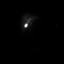
[frame 46/60]
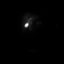
[frame 56/60]
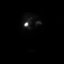

[Series 1000: gallbladder ef · 4.80mm/px · 6 of 120 frames shown]
[frame 11/120]
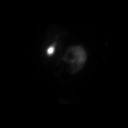
[frame 31/120]
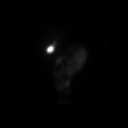
[frame 51/120]
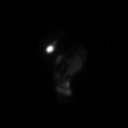
[frame 71/120]
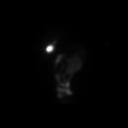
[frame 91/120]
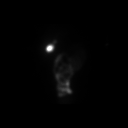
[frame 111/120]
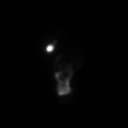

[12 of 12 positions shown; findings below may reference images not displayed]

FINDINGS: Prompt uptake and biliary excretion of activity by the liver is
seen. Gallbladder activity is visualized, consistent with patency of
cystic duct. Biliary activity passes into small bowel, consistent
with patent common bile duct.

Calculated gallbladder ejection fraction is 23%. (Normal gallbladder
ejection fraction with Ensure is greater than 33%.)
IMPRESSION: 1. No evidence of acute cholecystitis. Normal liver. Patent cystic
and common bile ducts.
2. Mild depression of the gallbladder ejection fraction to 23%,
which suggests gallbladder dysfunction.

## 2021-01-23 ENCOUNTER — Other Ambulatory Visit: Payer: Medicaid Other

## 2021-06-20 ENCOUNTER — Encounter: Payer: Self-pay | Admitting: Emergency Medicine

## 2021-06-20 ENCOUNTER — Ambulatory Visit
Admission: EM | Admit: 2021-06-20 | Discharge: 2021-06-20 | Disposition: A | Discharge status: Home / Self Care | Payer: Medicaid Other | Attending: Physician Assistant | Admitting: Physician Assistant

## 2021-06-20 ENCOUNTER — Other Ambulatory Visit: Payer: Self-pay

## 2021-06-20 ENCOUNTER — Ambulatory Visit (INDEPENDENT_AMBULATORY_CARE_PROVIDER_SITE_OTHER)
Admit: 2021-06-20 | Discharge: 2021-06-20 | Disposition: A | Discharge status: Home / Self Care | Payer: Medicaid Other | Attending: Internal Medicine | Admitting: Internal Medicine

## 2021-06-20 DIAGNOSIS — N760 Acute vaginitis: Secondary | ICD-10-CM

## 2021-06-20 DIAGNOSIS — M545 Low back pain, unspecified: Secondary | ICD-10-CM | POA: Diagnosis not present

## 2021-06-20 DIAGNOSIS — N200 Calculus of kidney: Secondary | ICD-10-CM | POA: Diagnosis not present

## 2021-06-20 DIAGNOSIS — R110 Nausea: Secondary | ICD-10-CM | POA: Diagnosis not present

## 2021-06-20 DIAGNOSIS — R109 Unspecified abdominal pain: Secondary | ICD-10-CM | POA: Insufficient documentation

## 2021-06-20 LAB — PREGNANCY, URINE: Preg Test, Ur: NEGATIVE

## 2021-06-20 LAB — WET PREP, GENITAL
Sperm: NONE SEEN
Trich, Wet Prep: NONE SEEN
WBC, Wet Prep HPF POC: >10 — AB (ref <10)

## 2021-06-20 LAB — URINALYSIS, ROUTINE W REFLEX MICROSCOPIC
Bilirubin Urine: NEGATIVE
Glucose, UA: NEGATIVE mg/dL
Hgb urine dipstick: NEGATIVE
Ketones, ur: NEGATIVE mg/dL
Leukocytes,Ua: NEGATIVE
Nitrite: NEGATIVE
Protein, ur: NEGATIVE mg/dL
Specific Gravity, Urine: 1.015 (ref 1.005–1.030)
pH: 5.5 (ref 5.0–8.0)

## 2021-06-20 MED ORDER — ONDANSETRON HCL 4 MG PO TABS: 4.0000 mg | ORAL_TABLET | Freq: Three times a day (TID) | ORAL | 0 refills | Status: AC | PRN

## 2021-06-20 MED ORDER — FLUCONAZOLE 200 MG PO TABS: ORAL_TABLET | ORAL | 0 refills | Status: DC

## 2021-06-20 MED ORDER — TAMSULOSIN HCL 0.4 MG PO CAPS: 0.4000 mg | ORAL_CAPSULE | Freq: Every day | ORAL | 0 refills | Status: AC

## 2021-06-20 MED ORDER — KETOROLAC TROMETHAMINE 60 MG/2ML IM SOLN
60.0000 mg | Freq: Once | INTRAMUSCULAR | Status: AC
  Administered 2021-06-20: 60 mg via INTRAMUSCULAR

## 2021-06-20 MED ORDER — KETOROLAC TROMETHAMINE 10 MG PO TABS: 10.0000 mg | ORAL_TABLET | Freq: Four times a day (QID) | ORAL | 0 refills | Status: DC | PRN

## 2021-06-20 MED ORDER — METRONIDAZOLE 500 MG PO TABS: 500.0000 mg | ORAL_TABLET | Freq: Two times a day (BID) | ORAL | 0 refills | Status: DC

## 2021-06-20 NOTE — Discharge Instructions (Addendum)
-  You do have a kidney stone in your left kidney.  I suspect this is probably why you are having the pain.  See the handout on kidney stones.  Your pain can move around.  I have given you ketorolac for pain relief.  You can also take Tylenol with this. ?- Increase your rest and fluid intake. ?- The tamsulosin or Flomax helps to open the tube that goes from your kidney to your bladder so he can pass the stone.  Have included a strainer so that when you urinate you may see if you are able to pass the stone. ?- I have also sent a referral to urology for follow-up.  If you have difficulty following up with urology, you may need to reach out to your PCP since you have Medicaid and they might have to put the referral in.  Sometimes he will cannot pass the kidney stone and they have to see a specialist. ?- If you develop a fever, increased pain, vomiting you need to go to the ER.  I did send Zofran to help with the nausea. ?- You also have BV and yeast infection so I have sent medication to treat those. ?- I culture the urine and we will call you if we need to send additional medicine. ?
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
## 2021-06-20 NOTE — ED Triage Notes (Signed)
Pt c/o lower left back, chills, urinary frequency, and odorous urine. Started about 3 days ago. Unsure if she has had a fever.  ?

## 2021-06-22 LAB — URINE CULTURE: Culture: <10000 — AB

## 2021-06-26 ENCOUNTER — Encounter: Payer: Self-pay | Admitting: *Deleted

## 2021-07-05 ENCOUNTER — Other Ambulatory Visit: Payer: Self-pay

## 2021-07-05 DIAGNOSIS — N200 Calculus of kidney: Secondary | ICD-10-CM

## 2021-07-05 NOTE — Addendum Note (Signed)
Addended by: Veneta PentonSULLIVAN, Takeela Peil on: 07/05/2021 01:03 PM   Modules accepted: Orders

## 2021-07-06 ENCOUNTER — Ambulatory Visit: Payer: Medicaid Other | Admitting: Urology

## 2021-07-06 NOTE — Progress Notes (Incomplete)
07/06/21 5:47 AM   Stacey Howe November 07, 1980 [id number]  Referring provider:  Inc, [facility] [address] [place],  [place] [address] No chief complaint on file.     HPI: Stacey Howe is a 41 y.o.female who presents today for further evaluation of calculus of kidney.   Se has a history of rUTIs and pyelonephritis.   She was seen in the ED on 06/20/2021. She presented with left flank pain, chills, urinary frequency, and milky white vaginal discharge. She also had pressure and suprapubic pain. Urinalysis showed moderate Hgb, nitrite positive, 6-10 RBCs HPF,  large leukocytes and many bacteria.Culture showed insignificant growth Wet prep showed yeast and clue cells she was treated with Diflucan and metronidazole. CT renal tone study visualized punctate  nonobstructing stone in the left kidney.     PMH: Past Medical History:  Diagnosis Date   Bipolar affective disorder (HCC) 2007   does not take meds as she states she does not need them any more   Chronic kidney disease    frequent uti   Chronic pain syndrome    Drug abuse, opioid type (HCC) 2014   Current remission   Pyelonephritis 09/08/14   Rheumatoid arthritis (HCC) 2016   Sickle cell trait (HCC)    Syphilis    history of syphilis, has been treated    Surgical History: Past Surgical History:  Procedure Laterality Date   CESAREAN SECTION  2000   CESAREAN SECTION  2015   MANDIBLE FRACTURE SURGERY  2019   metal plates and 2 screws in face   TUBAL LIGATION      Home Medications:  Allergies as of 07/06/2021       Reactions   Vicodin [hydrocodone-acetaminophen] Anaphylaxis   Morphine And Related Nausea And Vomiting        Medication List        Accurate as of Jul 06, 2021  5:47 AM. If you have any questions, ask your nurse or doctor.          buprenorphine-naloxone 8-2 mg Subl SL tablet Commonly known as: SUBOXONE Place 1 tablet under the tongue 2 (two) times daily.    fluconazole 200 MG tablet Commonly known as: DIFLUCAN Take 1 tab p.o. every 72 hours for yeast infection   gabapentin 600 MG tablet Commonly known as: NEURONTIN Take 600 mg by mouth 3 (three) times daily.   ibuprofen 800 MG tablet Commonly known as: ADVIL Take 1 tablet (800 mg total) by mouth every 8 (eight) hours as needed for mild pain or moderate pain.   ketorolac 10 MG tablet Commonly known as: TORADOL Take 1 tablet (10 mg total) by mouth every 6 (six) hours as needed.   metroNIDAZOLE 500 MG tablet Commonly known as: FLAGYL Take 1 tablet (500 mg total) by mouth 2 (two) times daily.   QUEtiapine 200 MG tablet Commonly known as: SEROQUEL Take 200 mg by mouth at bedtime.   sertraline 100 MG tablet Commonly known as: ZOLOFT Take 100 mg by mouth daily.   tamsulosin 0.4 MG Caps capsule Commonly known as: FLOMAX Take 1 capsule (0.4 mg total) by mouth daily.        Allergies:  Allergies  Allergen Reactions   Vicodin [Hydrocodone-Acetaminophen] Anaphylaxis   Morphine And Related Nausea And Vomiting    Family History: Family History  Problem Relation Age of Onset   Asthma Mother    HIV Mother    Drug abuse Mother    Arthritis Father  Cancer Maternal Aunt     Social History:  reports that she has been smoking cigarettes. She has a 7.50 pack-year smoking history. She has never used smokeless tobacco. She reports current drug use. Frequency: 7.00 times per week. Drug: Marijuana. She reports that she does not drink alcohol.   Physical Exam: LMP 06/06/2021 (Approximate) Comment: neg preg test 06/20/21  Constitutional:  Alert and oriented, No acute distress. HEENT: [place] AT, moist mucus membranes.  Trachea midline, no masses. Cardiovascular: No clubbing, cyanosis, or edema. Respiratory: Normal respiratory effort, no increased work of breathing. Skin: No rashes, bruises or suspicious lesions. Neurologic: Grossly intact, no focal deficits, moving all 4  extremities. Psychiatric: Normal mood and affect.  Laboratory Data:  Lab Results  Component Value Date   CREATININE 0.81 06/26/2019   No results found for: HGBA1C  Urinalysis   Pertinent Imaging: CLINICAL DATA:  Left low back pain with frequency   EXAM: CT ABDOMEN AND PELVIS WITHOUT CONTRAST   TECHNIQUE: Multidetector CT imaging of the abdomen and pelvis was performed following the standard protocol without IV contrast.   RADIATION DOSE REDUCTION: This exam was performed according to the departmental dose-optimization program which includes automated exposure control, adjustment of the mA and/or kV according to patient size and/or use of iterative reconstruction technique.   COMPARISON:  Ultrasound 05/22/2019   FINDINGS: Lower chest: No acute abnormality.   Hepatobiliary: No focal hepatic abnormality. Gallbladder not clearly identified, likely due to interval cholecystectomy. No biliary dilatation   Pancreas: Unremarkable. No pancreatic ductal dilatation or surrounding inflammatory changes.   Spleen: Normal in size without focal abnormality.   Adrenals/Urinary Tract: Adrenal glands are normal. Punctate nonobstructing stone mid left kidney. The bladder is unremarkable.   Stomach/Bowel: Stomach is within normal limits. Appendix appears normal. No evidence of bowel wall thickening, distention, or inflammatory changes.   Vascular/Lymphatic: No significant vascular findings are present. No enlarged abdominal or pelvic lymph nodes.   Reproductive: Uterus and bilateral adnexa are unremarkable.   Other: No abdominal wall hernia or abnormality. No abdominopelvic ascites.   Musculoskeletal: No acute or significant osseous findings.   IMPRESSION: 1. No CT evidence for acute intra-abdominal or pelvic abnormality. 2. Punctate nonobstructing stone in the left kidney     Electronically Signed   By: Jasmine PangKim  Fujinaga M.D.   On: 06/20/2021 19:05    I have personally  reviewed the images and agree with radiologist interpretation.     Assessment & Plan:     No follow-ups on file.  I,Kailey Littlejohn,acting as a Neurosurgeonscribe for Vanna ScotlandAshley Brandon, MD.,have documented all relevant documentation on the behalf of Vanna Scotlandshley Brandon, MD,as directed by  Vanna ScotlandAshley Brandon, MD while in the presence of Vanna ScotlandAshley Brandon, MD.   [facility] [address], [address] [place], [place] [address] [phone]

## 2021-10-17 ENCOUNTER — Ambulatory Visit
Admission: EM | Admit: 2021-10-17 | Discharge: 2021-10-17 | Disposition: A | Discharge status: Home / Self Care | Payer: Medicaid Other | Attending: Emergency Medicine | Admitting: Emergency Medicine

## 2021-10-17 DIAGNOSIS — K529 Noninfective gastroenteritis and colitis, unspecified: Secondary | ICD-10-CM | POA: Diagnosis not present

## 2021-10-17 MED ORDER — ONDANSETRON 8 MG PO TBDP: 8.0000 mg | ORAL_TABLET | Freq: Three times a day (TID) | ORAL | 0 refills | Status: AC | PRN

## 2021-10-17 MED ORDER — DICYCLOMINE HCL 20 MG PO TABS: 20.0000 mg | ORAL_TABLET | Freq: Two times a day (BID) | ORAL | 0 refills | Status: AC
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
## 2021-10-17 NOTE — ED Triage Notes (Signed)
Patient reports she has been weak, vomiting, diarrhea, chills, sweating-- Started within the last 3 days.  Patient reports that she did do an @ home Covid test and it was negative.

## 2021-10-17 NOTE — Discharge Instructions (Signed)
Take the Zofran every 8 hours as needed for nausea and vomiting.  They are an oral disintegrating tablet and you can place them on her under your tongue and then will be absorbed.  Use the Bentyl (dicyclomine) every 6 hours as needed for abdominal cramping.  Follow a clear liquid diet for the next 6 to 12 hours.  Clear liquids consist of broth, ginger ale, water, Pedialyte, and Jell-O.  After 6 to 12 hours, if you are tolerating clear liquids, you can advance to bland foods such as bananas, rice, applesauce, and toast.  If you tolerate bland foods you can continue to advance your diet as you see fit.  If you develop a fever over 100.5, increased abdominal pain, bloody vomit, or bloody stool return for reevaluation or go to the ER.  

## 2022-07-12 ENCOUNTER — Other Ambulatory Visit: Payer: Self-pay | Admitting: Chiropractor

## 2022-07-12 ENCOUNTER — Ambulatory Visit
Admission: RE | Admit: 2022-07-12 | Discharge: 2022-07-12 | Disposition: A | Discharge status: Home / Self Care | Payer: Medicaid Other | Source: Ambulatory Visit | Attending: Chiropractor | Admitting: Chiropractor

## 2022-07-12 DIAGNOSIS — S39012A Strain of muscle, fascia and tendon of lower back, initial encounter: Secondary | ICD-10-CM | POA: Insufficient documentation

## 2022-07-12 DIAGNOSIS — S161XXA Strain of muscle, fascia and tendon at neck level, initial encounter: Secondary | ICD-10-CM | POA: Diagnosis present

## 2022-07-12 DIAGNOSIS — S29012A Strain of muscle and tendon of back wall of thorax, initial encounter: Secondary | ICD-10-CM

## 2023-05-09 ENCOUNTER — Telehealth: Payer: Self-pay | Admitting: Nurse Practitioner

## 2023-05-09 ENCOUNTER — Ambulatory Visit
Admission: EM | Admit: 2023-05-09 | Discharge: 2023-05-09 | Disposition: A | Discharge status: Home / Self Care | Payer: MEDICAID | Attending: Nurse Practitioner | Admitting: Nurse Practitioner

## 2023-05-09 DIAGNOSIS — H1032 Unspecified acute conjunctivitis, left eye: Secondary | ICD-10-CM

## 2023-05-09 MED ORDER — TOBRAMYCIN 0.3 % OP SOLN: 2.0000 [drp] | OPHTHALMIC | 0 refills | Status: DC

## 2023-05-09 MED ORDER — POLYMYXIN B-TRIMETHOPRIM 10000-0.1 UNIT/ML-% OP SOLN: 1.0000 [drp] | OPHTHALMIC | Status: DC

## 2023-05-09 MED ORDER — PROPARACAINE HCL 0.5 % OP SOLN: 1.0000 [drp] | Freq: Once | OPHTHALMIC | Status: DC

## 2023-05-09 MED ORDER — TOBRAMYCIN 0.3 % OP SOLN: 2.0000 [drp] | OPHTHALMIC | 0 refills | Status: AC
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
## 2023-05-09 NOTE — Discharge Instructions (Addendum)
 You have been given a dose of tetracaine to your left eye.  This is a numbing agent you help with your current pain.  You have been prescribed tobramycin 0.3% 2 drops left eye every 4 hours. Wash your hands often with soap and water for at least 20 seconds and especially before touching your face or eyes. Use paper towels to dry your hands.  This should improve over the next 48 to 72 hours.  If not you are encouraged to follow-up here the ED or with your eye specialist.

## 2023-05-09 NOTE — ED Triage Notes (Signed)
 Pt c/o left eye swelling x4days  Pt states that she felt like there was lint in her eye and has been looking in her eye to see if there was any debris.   Pt states that she is having white drainage from her eye  Pt has been using warm compresses and otc tylenol and it has not been helping.

## 2023-08-14 ENCOUNTER — Inpatient Hospital Stay: Payer: MEDICAID | Admitting: Internal Medicine

## 2023-08-14 ENCOUNTER — Inpatient Hospital Stay: Payer: MEDICAID

## 2023-08-14 DIAGNOSIS — D649 Anemia, unspecified: Secondary | ICD-10-CM | POA: Insufficient documentation

## 2023-08-14 NOTE — Assessment & Plan Note (Deleted)
#   Anemia- Hb-symptomatic.June 2025- ferritin-6; Hb 9-10 [PCP]  Likely due to iron deficiency - from etiology GI blood loss/menorrhagia/malabsorption.  Poor tolerance/lack of improvement on oral iron.  Discussed regarding IV iron infusion/Venofer. Discussed the potential acute infusion reactions with IV iron; which are quite rare.  Patient understands the risk; will proceed with infusions.   # Recommend CBC CMP LDH peripheral smear; haptoglobin; iron studies ferritin B12 folic acid; Urine analysis.  Urine pregnancy test.   #Etiology of iron deficiency: Unclear; I had a long discussion with the patient regarding multiple etiologies of anemia including iron deficiency-which is mainly caused by blood loss/malabsorption.  Recommend GI evaluation-EGD colonoscopy; capsule study; CT scan abdomen pelvis.   I had a long discussion with the patient regarding various forms of dietary iron as it is present universally in meat.  Also discussed regarding plant sources-including but not limited to leafy vegetables [spinach], broccoli. Recommend gentle iron 1 pill a day which should not upset stomach or cause constipation.  Patient is intolerant to oral iron/or iron levels do not improve on oral supplementation-recommend IV iron infusions.     Thank you Ms. Johnie. for allowing me to participate in the care of your pleasant patient. Please do not hesitate to contact me with questions or concerns in the interim.   # DISPOSITION: # NO labs today; but needs-  Urine pregnancy test. # weekly venofer x3  # follow up 5 weeks- MD; labs- cbc/bmp; possible venofer- Dr.B

## 2023-08-14 NOTE — Progress Notes (Deleted)
 [facility] CONSULT NOTE  Patient Care Team: Johnie Perkins, PA-C as PCP - General (Family Medicine) Rennie Cindy SAUNDERS, MD as Consulting Physician (Oncology)  CHIEF COMPLAINTS/PURPOSE OF CONSULTATION: ANEMIA   HEMATOLOGY HISTORY  # ANEMIA[Hb; MCV-platelets- WBC; Iron sat; ferritin;  GFR- CT/US - ;   08/01/2023      Ferritin 8 Low      CBC   HISTORY OF PRESENTING ILLNESS:  Stacey Howe 43 y.o.  female pleasant patient is  been referred to us  for further evaluation of anemia.  Patient complains of shortness of breath with exertion.  Also complains of excessive fatigue.  Complains of dizziness especially on standing.  Pica:-   Blood in stools: EGD/colonoscopy- Blood in urine: Difficulty swallowing: Change of bowel movement/constipation: Prior blood transfusion: Kidney/Liver disease: Alcohol:  Bariatric surgery:   Vaginal bleeding:  Prior evaluation with hematology: Prior bone marrow biopsy:  Oral iron: ? Prior IV iron infusions:        Review of Systems  Constitutional:  Positive for malaise/fatigue. Negative for chills, diaphoresis, fever and weight loss.  HENT:  Negative for nosebleeds and sore throat.   Eyes:  Negative for double vision.  Respiratory:  Negative for cough, hemoptysis, sputum production, shortness of breath and wheezing.   Cardiovascular:  Negative for chest pain, palpitations, orthopnea and leg swelling.  Gastrointestinal:  Negative for abdominal pain, blood in stool, constipation, diarrhea, heartburn, melena, nausea and vomiting.  Genitourinary:  Negative for dysuria, frequency and urgency.  Musculoskeletal:  Negative for back pain and joint pain.  Skin: Negative.  Negative for itching and rash.  Neurological:  Negative for dizziness, tingling, focal weakness, weakness and headaches.  Endo/Heme/Allergies:  Does not bruise/bleed easily.  Psychiatric/Behavioral:  Negative for depression. The patient is not  nervous/anxious and does not have insomnia.      MEDICAL HISTORY:  Past Medical History:  Diagnosis Date   Bipolar affective disorder (HCC) 2007   does not take meds as she states she does not need them any more   Chronic kidney disease    frequent uti   Chronic pain syndrome    Drug abuse, opioid type (HCC) 2014   Current remission   Pyelonephritis 09/08/14   Rheumatoid arthritis (HCC) 2016   Sickle cell trait (HCC)    Syphilis    history of syphilis, has been treated    SURGICAL HISTORY: Past Surgical History:  Procedure Laterality Date   CESAREAN SECTION  2000   CESAREAN SECTION  2015   MANDIBLE FRACTURE SURGERY  2019   metal plates and 2 screws in face   TUBAL LIGATION      SOCIAL HISTORY: Social History   Socioeconomic History   Marital status: Single    Spouse name: Not on file   Number of children: Not on file   Years of education: Not on file   Highest education level: Not on file  Occupational History    Comment: unemployed  Tobacco Use   Smoking status: Heavy Smoker    Current packs/day: 0.50    Average packs/day: 0.5 packs/day for 15.0 years (7.5 ttl pk-yrs)    Types: Cigarettes   Smokeless tobacco: Never  Vaping Use   Vaping status: Never Used  Substance and Sexual Activity   Alcohol use: Yes    Comment: occasionally   Drug use: Yes    Frequency: 7.0 times per week    Types: Marijuana    Comment: currently uses marijuana   Sexual activity: Yes  Birth control/protection: None  Other Topics Concern   Not on file  Social History Narrative   Patient lives with boyfriend and her kids.   Social Drivers of Health   Financial Resource Strain: Medium Risk (08/01/2023)   Received from [facility]   Overall Financial Resource Strain (CARDIA)    Difficulty of Paying Living Expenses: Somewhat hard  Food Insecurity: Patient Declined (08/01/2023)   Received from [facility]   Hunger Vital Sign    Within the past  12 months, you worried that your food would run out before you got the money to buy more.: Patient declined    Within the past 12 months, the food you bought just didn't last and you didn't have money to get more.: Patient declined  Transportation Needs: Patient Declined (08/01/2023)   Received from [facility] - Transportation    In the past 12 months, has lack of transportation kept you from medical appointments or from getting medications?: Patient declined    Lack of Transportation (Non-Medical): Patient declined  Physical Activity: Inactive (08/27/2017)   Received from [facility]   Exercise Vital Sign    Days of Exercise per Week: 0 days    Minutes of Exercise per Session: 0 min  Stress: No Stress Concern Present (08/27/2017)   Received from [facility] - Occupational Stress Questionnaire    Feeling of Stress : Not at all  Social Connections: Unknown (08/27/2017)   Received from [facility]   Social Connection and Isolation Panel    Frequency of Communication with Friends and Family: Patient declined    Frequency of Social Gatherings with Friends and Family: Patient declined    Attends Religious Services: Patient declined    Database administrator or Organizations: Patient declined    Attends Engineer, structural: Patient declined    Marital Status: Patient declined  Catering manager Violence: Not on file    FAMILY HISTORY: Family History  Problem Relation Age of Onset   Asthma Mother    HIV Mother    Drug abuse Mother    Arthritis Father    Cancer Maternal Aunt     ALLERGIES:  is allergic to vicodin [hydrocodone-acetaminophen ] and morphine  and codeine.  MEDICATIONS:  Current Outpatient Medications  Medication Sig Dispense Refill   buprenorphine-naloxone (SUBOXONE) 8-2 mg SUBL SL tablet Place 1 tablet under the tongue 2 (two) times daily.      clonazePAM (KLONOPIN) 1 MG tablet Take 1 mg by mouth 2 (two) times daily.     dicyclomine  (BENTYL ) 20 MG tablet Take 1 tablet (20 mg total) by mouth 2 (two) times daily. 20 tablet 0   Ferrous Sulfate (IRON) 325 (65 Fe) MG TABS IRON 325 (65 Fe) MG TABS     gabapentin  (NEURONTIN ) 600 MG tablet Take 600 mg by mouth 3 (three) times daily.     ondansetron  (ZOFRAN -ODT) 8 MG disintegrating tablet Take 1 tablet (8 mg total) by mouth every 8 (eight) hours as needed for nausea or vomiting. 20 tablet 0   sertraline (ZOLOFT) 100 MG tablet Take 100 mg by mouth daily.     tobramycin  (TOBREX ) 0.3 % ophthalmic solution Place 2 drops into the left eye every 4 (four) hours. 5 mL 0   No current facility-administered medications for this visit.     PHYSICAL EXAMINATION:   There were no vitals filed for  this visit. There were no vitals filed for this visit.  Physical Exam Vitals and nursing note reviewed.  HENT:     Head: Normocephalic and atraumatic.     Mouth/Throat:     Pharynx: Oropharynx is clear.   Eyes:     Extraocular Movements: Extraocular movements intact.     Pupils: Pupils are equal, round, and reactive to light.    Cardiovascular:     Rate and Rhythm: Normal rate and regular rhythm.  Pulmonary:     Comments: Decreased breath sounds bilaterally.  Abdominal:     Palpations: Abdomen is soft.   Musculoskeletal:        General: Normal range of motion.     Cervical back: Normal range of motion.   Skin:    General: Skin is warm.   Neurological:     General: No focal deficit present.     Mental Status: She is alert and oriented to person, place, and time.   Psychiatric:        Behavior: Behavior normal.        Judgment: Judgment normal.      LABORATORY DATA:  I have reviewed the data as listed Lab Results  Component Value Date   WBC 13.9 (H) 06/26/2019   HGB 13.5 06/26/2019   HCT 40.7 06/26/2019   MCV 79.0 (L) 06/26/2019   PLT 306 06/26/2019   No results for  input(s): NA, K, CL, CO2, GLUCOSE, BUN, CREATININE, CALCIUM, GFRNONAA, GFRAA, PROT, ALBUMIN, AST, ALT, ALKPHOS, BILITOT, BILIDIR, IBILI in the last 8760 hours.   No results found.  ASSESSMENT & PLAN:   No problem-specific Assessment & Plan notes found for this encounter.    All questions were answered. The patient knows to call the clinic with any problems, questions or concerns.    Cindy JONELLE Joe, MD 08/14/2023 9:43 AM
